# Patient Record
Sex: Female | Born: 1970 | Race: Black or African American | Hispanic: No | Marital: Single | Smoking: Never smoker
Health system: Southern US, Community
[De-identification: ages and names within clinical notes are randomized; demographics above are authoritative.]

## PROBLEM LIST (undated history)

## (undated) DIAGNOSIS — R011 Cardiac murmur, unspecified: Secondary | ICD-10-CM

## (undated) DIAGNOSIS — I10 Essential (primary) hypertension: Secondary | ICD-10-CM

## (undated) HISTORY — DX: Essential (primary) hypertension: I10

## (undated) HISTORY — PX: TONSILLECTOMY: SUR1361

## (undated) HISTORY — PX: FOOT SURGERY: SHX648

---

## 2014-08-28 ENCOUNTER — Other Ambulatory Visit: Payer: Self-pay

## 2014-08-28 ENCOUNTER — Other Ambulatory Visit (HOSPITAL_COMMUNITY)
Admission: RE | Admit: 2014-08-28 | Discharge: 2014-08-28 | Disposition: A | Discharge status: Home / Self Care | Payer: 59 | Source: Ambulatory Visit | Attending: Obstetrics and Gynecology | Admitting: Obstetrics and Gynecology

## 2014-08-28 DIAGNOSIS — Z1151 Encounter for screening for human papillomavirus (HPV): Secondary | ICD-10-CM | POA: Diagnosis present

## 2014-08-28 DIAGNOSIS — Z1231 Encounter for screening mammogram for malignant neoplasm of breast: Secondary | ICD-10-CM

## 2014-08-28 DIAGNOSIS — Z01419 Encounter for gynecological examination (general) (routine) without abnormal findings: Secondary | ICD-10-CM | POA: Insufficient documentation

## 2014-09-12 ENCOUNTER — Ambulatory Visit
Admission: RE | Admit: 2014-09-12 | Discharge: 2014-09-12 | Disposition: A | Discharge status: Home / Self Care | Payer: 59 | Source: Ambulatory Visit

## 2014-09-12 DIAGNOSIS — Z1231 Encounter for screening mammogram for malignant neoplasm of breast: Secondary | ICD-10-CM

## 2014-10-12 ENCOUNTER — Telehealth: Payer: Self-pay | Admitting: Oncology

## 2014-10-12 NOTE — Telephone Encounter (Signed)
S/W PT IN REF TO NP APPT. ON  10/26/14@10 :30 REFERRING DR VARNADO FAMILY HX OF CLOTTING DISORDER

## 2014-10-12 NOTE — Telephone Encounter (Signed)
DELIVERED CHART 10/12/14

## 2014-10-25 ENCOUNTER — Telehealth: Payer: Self-pay | Admitting: Medical Oncology

## 2014-10-25 NOTE — Telephone Encounter (Signed)
Confirmed tomorrow's appt with patient. Requested for patient to bring current list of medications. Pt denies questions at this time.

## 2014-10-26 ENCOUNTER — Telehealth: Payer: Self-pay | Admitting: Oncology

## 2014-10-26 ENCOUNTER — Other Ambulatory Visit: Payer: 59

## 2014-10-26 ENCOUNTER — Ambulatory Visit (HOSPITAL_BASED_OUTPATIENT_CLINIC_OR_DEPARTMENT_OTHER): Payer: 59 | Admitting: Oncology

## 2014-10-26 ENCOUNTER — Ambulatory Visit (HOSPITAL_BASED_OUTPATIENT_CLINIC_OR_DEPARTMENT_OTHER): Payer: 59

## 2014-10-26 ENCOUNTER — Ambulatory Visit: Payer: 59

## 2014-10-26 ENCOUNTER — Encounter: Payer: Self-pay | Admitting: Oncology

## 2014-10-26 VITALS — BP 123/63 | HR 86 | Temp 98.4°F | Resp 18 | Ht 73.5 in | Wt 256.8 lb

## 2014-10-26 DIAGNOSIS — Z86718 Personal history of other venous thrombosis and embolism: Secondary | ICD-10-CM

## 2014-10-26 NOTE — Telephone Encounter (Signed)
gv and printed avs and sent to lab

## 2014-10-26 NOTE — Progress Notes (Signed)
Checked in new patient with no issues prior to seeing the dr. She has appt card and [place] been out of the [place].

## 2014-10-26 NOTE — Progress Notes (Signed)
Please see consult note.  

## 2014-10-26 NOTE — Consult Note (Signed)
Reason for Referral: family history of thrombosis.  HPI: 43 year old woman with history of hypertension and chronic lower extremity edema but otherwise rather healthy referred to me for family history of thrombosis. She reports that she have not really had any personal history of deep vein thrombosis, pulmonary embolism, phlebitis or any arterial clots. Her sister however, developed a blood clot close to 10 years ago and required IVC filter placement and chronic anticoagulation with Coumadin. She reported that she had extensive testing although it is unclear that she has an inherited thrombophilia diagnosed. She denied any personal history of any thrombosis as mentioned that she did have one early trimester miscarriage. She did have one pregnancy courier to term years ago without any complications. She is currently on birth control in the form of oral contraceptives and have not had any thrombosis on. She also had foot surgery also without any thrombosis.  Clinically, she is completely asymptomatic at this point. She does not report any headaches or blurry vision or syncope. She does not report any fevers, chills, sweats or weight loss. She does not report any chest pain shortness of breath cough or hemoptysis. She does report lower extremity edema but no palpitation orthopnea. Does not report any nausea, vomiting or change in her bowel habits. She does not report any urinary symptoms. Rest of review of systems unremarkable.  Past Medical History  Diagnosis Date  . Hypertension   :  She is S/P foot surgery.   Medications: Current Outpatient Prescriptions  Medication Sig Dispense Refill  . furosemide (LASIX) 40 MG tablet Take 40 mg by mouth as needed (1-2 tablets as needed for edema).    . LO LOESTRIN FE 1 MG-10 MCG / 10 MCG tablet   12  . losartan-hydrochlorothiazide (HYZAAR) 100-25 MG per tablet   1   No current facility-administered medications for this visit.    Family history: No history  of documented thrombosis or blood clots. No history of strokes or myocardial infarctions. Her mother had lung cancer there is history of kidney cancer remotely in her family. Her sister had blood clots as mentioned above but no other thrombosis history.   History   Social History  . Marital Status: Unknown    Spouse Name: N/A    Number of Children: N/A  . Years of Education: N/A   Occupational History  . Not on file.   Social History Main Topics  . Smoking status: Not on file  . Smokeless tobacco: Not on file  . Alcohol Use: Not on file  . Drug Use: Not on file  . Sexual Activity: Not on file   Other Topics Concern  . Not on file   Social History Narrative  . No narrative on file  :  Pertinent items are noted in HPI.  Exam: ECOG 0 Blood pressure 123/63, pulse 86, temperature 98.4 F (36.9 C), temperature source Oral, resp. rate 18, height 6' 1.5" (1.867 m), weight 256 lb 12.8 oz (116.484 kg). General appearance: alert and cooperative Head: Normocephalic, without obvious abnormality Throat: lips, mucosa, and tongue normal; teeth and gums normal Neck: no adenopathy Back: negative Resp: clear to auscultation bilaterally Chest wall: no tenderness Cardio: regular rate and rhythm, S1, S2 normal, no murmur, click, rub or gallop GI: soft, non-tender; bowel sounds normal; no masses,  no organomegaly Extremities: extremities normal, atraumatic, no cyanosis or edema Pulses: 2+ and symmetric 1+ edema bilaterally. Skin: Skin color, texture, turgor normal. No rashes or lesions   Assessment and Plan:  43 year old woman with no personal history of thrombosis but a family history of thrombosis specifically related to her sister. It is unclear whether her sister had clear-cut diagnosed thrombophilia but appears to have had extensive blood clots that required IVC filter. She is currently followed by hematology. At this point, I feel that she is low risk of developing blood clots and  low risk of having an inherited thrombophilia. She had a pregnancy to term, previous surgery and long-term oral contraceptive use without documented history of thrombosis. But for completeness sake, I will obtain a hypercoagulable panel and communicate these findings to the patient. If she has no inherited thrombophilia, then no further testing will be required. Clearly, if she has anything abnormal will discuss that with her and not necessarily require anticoagulation but certainly precaution regarding oral contraceptives.  All her questions were answered to her satisfaction.

## 2014-10-31 LAB — HYPERCOAGULABLE PANEL, COMPREHENSIVE
ANTICARDIOLIPIN IGG: 10 GPL U/mL (ref <23)
AntiThromb III Func: 113 % (ref 76–126)
Anticardiolipin IgA: 7 APL U/mL (ref <22)
Anticardiolipin IgM: 1 MPL U/mL (ref <11)
BETA 2 GLYCO I IGG: 0 G Units (ref <20)
Beta-2-Glycoprotein I IgA: 8 A Units (ref <20)
Beta-2-Glycoprotein I IgM: 5 M Units (ref <20)
DRVVT: 35.8 s (ref <42.9)
Lupus Anticoagulant: NOT DETECTED
PROTEIN C ACTIVITY: 126 % (ref 75–133)
PROTEIN S ACTIVITY: 61 % — AB (ref 69–129)
PROTEIN S TOTAL: 64 % (ref 60–150)
PTT Lupus Anticoagulant: 27.8 secs — ABNORMAL LOW (ref 28.0–43.0)
Protein C, Total: 76 % (ref 72–160)

## 2015-10-24 ENCOUNTER — Encounter (HOSPITAL_COMMUNITY): Payer: Self-pay | Admitting: Family Medicine

## 2015-10-24 ENCOUNTER — Other Ambulatory Visit: Payer: Self-pay

## 2015-10-24 ENCOUNTER — Emergency Department (HOSPITAL_COMMUNITY): Payer: 59

## 2015-10-24 ENCOUNTER — Emergency Department (HOSPITAL_COMMUNITY)
Admission: EM | Admit: 2015-10-24 | Discharge: 2015-10-24 | Disposition: A | Discharge status: Home / Self Care | Payer: 59 | Attending: Physician Assistant | Admitting: Physician Assistant

## 2015-10-24 DIAGNOSIS — R079 Chest pain, unspecified: Secondary | ICD-10-CM | POA: Insufficient documentation

## 2015-10-24 DIAGNOSIS — Z79899 Other long term (current) drug therapy: Secondary | ICD-10-CM | POA: Diagnosis not present

## 2015-10-24 DIAGNOSIS — R011 Cardiac murmur, unspecified: Secondary | ICD-10-CM | POA: Diagnosis not present

## 2015-10-24 DIAGNOSIS — I10 Essential (primary) hypertension: Secondary | ICD-10-CM | POA: Diagnosis not present

## 2015-10-24 HISTORY — DX: Cardiac murmur, unspecified: R01.1

## 2015-10-24 LAB — BASIC METABOLIC PANEL
Anion gap: 8 (ref 5–15)
BUN: 10 mg/dL (ref 6–20)
CALCIUM: 8.7 mg/dL — AB (ref 8.9–10.3)
CO2: 29 mmol/L (ref 22–32)
CREATININE: 0.92 mg/dL (ref 0.44–1.00)
Chloride: 101 mmol/L (ref 101–111)
GFR calc non Af Amer: >60 mL/min (ref >60)
Glucose, Bld: 94 mg/dL (ref 65–99)
Potassium: 2.7 mmol/L — CL (ref 3.5–5.1)
SODIUM: 138 mmol/L (ref 135–145)

## 2015-10-24 LAB — LIPASE, BLOOD: Lipase: 26 U/L (ref 11–51)

## 2015-10-24 LAB — CBC
HCT: 36.6 % (ref 36.0–46.0)
Hemoglobin: 12.1 g/dL (ref 12.0–15.0)
MCH: 27.6 pg (ref 26.0–34.0)
MCHC: 33.1 g/dL (ref 30.0–36.0)
MCV: 83.6 fL (ref 78.0–100.0)
PLATELETS: 270 10*3/uL (ref 150–400)
RBC: 4.38 MIL/uL (ref 3.87–5.11)
RDW: 13 % (ref 11.5–15.5)
WBC: 4.4 10*3/uL (ref 4.0–10.5)

## 2015-10-24 LAB — D-DIMER, QUANTITATIVE (NOT AT ARMC): D DIMER QUANT: 0.28 ug{FEU}/mL (ref 0.00–0.48)

## 2015-10-24 LAB — I-STAT TROPONIN, ED: TROPONIN I, POC: 0.01 ng/mL (ref 0.00–0.08)

## 2015-10-24 MED ORDER — POTASSIUM CHLORIDE CRYS ER 20 MEQ PO TBCR
40.0000 meq | EXTENDED_RELEASE_TABLET | Freq: Once | ORAL | Status: AC
  Administered 2015-10-24: 40 meq via ORAL
  Filled 2015-10-24: qty 2

## 2015-10-24 MED ORDER — ASPIRIN 81 MG PO CHEW
324.0000 mg | CHEWABLE_TABLET | Freq: Once | ORAL | Status: AC
  Administered 2015-10-24: 324 mg via ORAL
  Filled 2015-10-24: qty 4

## 2015-10-24 MED ORDER — POTASSIUM CHLORIDE 10 MEQ/100ML IV SOLN
10.0000 meq | Freq: Once | INTRAVENOUS | Status: AC
  Administered 2015-10-24: 10 meq via INTRAVENOUS
  Filled 2015-10-24: qty 100

## 2015-10-24 NOTE — ED Notes (Signed)
Pt here for chest pain that started last night. sts was just sitting when it started. Denies injury or strenuous activity.

## 2015-10-24 NOTE — ED Notes (Signed)
Pt placed in gown and in bed. Pt monitored by pulse ox, bp cuff, and 12-lead. 

## 2015-10-24 NOTE — ED Provider Notes (Signed)
CSN: [id number]     Arrival date & time 10/24/15  0807 History   First MD Initiated Contact with Patient 10/24/15 0818     Chief Complaint  Patient presents with  . Chest Pain   (Consider location/radiation/quality/duration/timing/severity/associated sxs/prior Treatment) HPI Comments: Report dull left sided chest pain starting at 8 pm last night after getting out of shower. She had difficulties sleeping due to pain 7/10 so came in for evaluation. Denies SOB, palpitations, dizziness, cough, fevers or history of MI, CAD or PE. Recently diagnosed with mitral regurg  Patient is a 44 y.o. female presenting with chest pain. The history is provided by the patient.  Chest Pain Pain location:  L lateral chest Pain quality: aching   Pain radiates to:  Upper back Pain radiates to the back: yes   Pain severity:  Moderate Onset quality:  Gradual Duration:  12 hours Timing:  Constant Progression:  Waxing and waning Chronicity:  New Context: at rest   Context: not breathing, no drug use, not eating, not lifting, no movement, no stress and no trauma   Relieved by:  Certain positions Worsened by:  Nothing tried Ineffective treatments:  None tried Associated symptoms: lower extremity edema   Associated symptoms: no abdominal pain, no altered mental status, no anorexia, no anxiety, no claudication, no cough, no diaphoresis, no dizziness, no fatigue, no fever, no headache, no heartburn, no nausea, no near-syncope, no numbness, no orthopnea, no palpitations, no shortness of breath and no weakness   Risk factors: birth control and hypertension   Risk factors: no coronary artery disease, no diabetes mellitus, not female, not pregnant, no prior DVT/PE, no smoking and no surgery     Past Medical History  Diagnosis Date  . Hypertension   . Heart murmur    Past Surgical History  Procedure Laterality Date  . Tonsillectomy     History reviewed. No pertinent family history. Social History  Substance Use  Topics  . Smoking status: Never Smoker   . Smokeless tobacco: None  . Alcohol Use: No   OB History    No data available     Review of Systems  Constitutional: Negative for fever, diaphoresis, activity change, appetite change and fatigue.  HENT: Negative.   Respiratory: Negative for apnea, cough, chest tightness and shortness of breath.   Cardiovascular: Positive for chest pain and leg swelling. Negative for palpitations, orthopnea, claudication and near-syncope.  Gastrointestinal: Negative for heartburn, nausea, abdominal pain and anorexia.  Skin: Negative for rash.  Neurological: Negative for dizziness, weakness, numbness and headaches.    Allergies  Keflex  Home Medications   Prior to Admission medications   Medication Sig Start Date End Date Taking? Authorizing Provider  furosemide (LASIX) 40 MG tablet Take 40 mg by mouth as needed (1-2 tablets as needed for edema).   Yes Historical Provider, MD  losartan-hydrochlorothiazide (HYZAAR) 100-25 MG per tablet  10/04/14  Yes Historical Provider, MD  MINASTRIN 24 FE 1-20 MG-MCG(24) CHEW Chew 1 tablet by mouth daily. 10/11/15  Yes Historical Provider, MD   BP 144/86 mmHg  Pulse 65  Temp(Src) 98.5 F (36.9 C)  Resp 14  SpO2 100%  LMP 10/09/2015 Physical Exam  Constitutional: She is oriented to person, place, and time. She appears well-developed and well-nourished. No distress.  Eyes: Pupils are equal, round, and reactive to light.  Cardiovascular: Normal rate, regular rhythm and normal heart sounds.   No murmur heard. No chest wall tenderness  Pulmonary/Chest: Effort normal and breath sounds normal.  No respiratory distress.  Abdominal: Soft. Bowel sounds are normal. There is no tenderness.  Musculoskeletal: Normal range of motion.  Neurological: She is alert and oriented to person, place, and time. No cranial nerve deficit.  Skin: No rash noted. She is not diaphoretic.    ED Course  Procedures (including critical care  time) Labs Review Labs Reviewed  BASIC METABOLIC PANEL - Abnormal; Notable for the following:    Potassium 2.7 (*)    Calcium 8.7 (*)    All other components within normal limits  CBC  LIPASE, BLOOD  D-DIMER, QUANTITATIVE (NOT AT [facility])  Randolm Idol, ED    Imaging Review Dg Chest 2 View  10/24/2015  CLINICAL DATA:  LEFT breast pain. Aching chest pain. Pain for 24 hours. EXAM: CHEST  2 VIEW COMPARISON:  None. FINDINGS: Normal cardiac silhouette. No effusion, infiltrate, or pneumothorax. Nodular densities at lung bases likely represent nipple shadows. No osseous abnormality. IMPRESSION: No acute cardiopulmonary process. Electronically Signed   By: Suzy Bouchard M.D.   On: 10/24/2015 08:56   I have personally reviewed and evaluated these images and lab results as part of my medical decision-making.   EKG Interpretation   Date/Time:  Wednesday October 24 2015 08:59:29 EST Ventricular Rate:  66 PR Interval:  143 QRS Duration: 80 QT Interval:  396 QTC Calculation: 415 R Axis:   60 Text Interpretation:  Sinus rhythm Borderline repolarization abnormality  no acute ischemia T wave in version lead 3 No significant change since  last tracing Confirmed by Gerald Leitz ([id number]) on 10/24/2015 9:16:57  AM      MDM   Final diagnoses:  Chest pain, unspecified chest pain type    44 year old female with reported mitral valve prolapse comes in for evaluation of chest pain.  Initial EKG and troponin negative for ACS.  Chest x-ray was nonrevealing.  Due to family history of blood clots and current birth control use a D-dimer was obtained and was negative.  Heart score 1; her chest pain was most likely muscle skeletal in nature and she was deemed safe to follow-up with PCP and cardiologist for ongoing evaluation needed.  Blood work revealed hypokalemia which was replaced, and she was discharged to follow with her PCP for repeat potassium check.     Olam Idler, MD 10/24/15  1024  Courteney Julio Alm, MD 10/24/15 1527

## 2015-10-24 NOTE — Discharge Instructions (Signed)
The blood work and chest x-ray were negative for sign of heart or lung problems. Your chest pain is most likely musculoskeletal in nature.  You can take Tylenol and ibuprofen as needed.  Call or return to the ED if he develops palpitations, shortness of breath, dizziness.  Recommend following up with your primary care and cardiologist for your low potassium.   Chest Wall Pain Chest wall pain is pain in or around the bones and muscles of your chest. Sometimes, an injury causes this pain. Sometimes, the cause may not be known. This pain may take several weeks or longer to get better. HOME CARE INSTRUCTIONS  Pay attention to any changes in your symptoms. Take these actions to help with your pain:   Rest as told by your health care provider.   Avoid activities that cause pain. These include any activities that use your chest muscles or your abdominal and side muscles to lift heavy items.   If directed, apply ice to the painful area:  Put ice in a plastic bag.  Place a towel between your skin and the bag.  Leave the ice on for 20 minutes, 2-3 times per day.  Take over-the-counter and prescription medicines only as told by your health care provider.  Do not use tobacco products, including cigarettes, chewing tobacco, and e-cigarettes. If you need help quitting, ask your health care provider.  Keep all follow-up visits as told by your health care provider. This is important. SEEK MEDICAL CARE IF:  You have a fever.  Your chest pain becomes worse.  You have new symptoms. SEEK IMMEDIATE MEDICAL CARE IF:  You have nausea or vomiting.  You feel sweaty or light-headed.  You have a cough with phlegm (sputum) or you cough up blood.  You develop shortness of breath.   This information is not intended to replace advice given to you by your health care provider. Make sure you discuss any questions you have with your health care provider.   Document Released: 12/01/2005 Document Revised:  08/22/2015 Document Reviewed: 02/26/2015 Elsevier Interactive Patient Education [employer].

## 2015-10-24 NOTE — ED Notes (Signed)
Critical lab  Potassium 2.7

## 2016-01-25 ENCOUNTER — Other Ambulatory Visit: Payer: Self-pay

## 2016-01-25 DIAGNOSIS — Z1231 Encounter for screening mammogram for malignant neoplasm of breast: Secondary | ICD-10-CM

## 2016-02-13 ENCOUNTER — Ambulatory Visit
Admission: RE | Admit: 2016-02-13 | Discharge: 2016-02-13 | Disposition: A | Discharge status: Home / Self Care | Payer: 59 | Source: Ambulatory Visit

## 2016-02-13 DIAGNOSIS — Z1231 Encounter for screening mammogram for malignant neoplasm of breast: Secondary | ICD-10-CM

## 2016-04-15 ENCOUNTER — Emergency Department (HOSPITAL_COMMUNITY): Payer: 59

## 2016-04-15 ENCOUNTER — Emergency Department (HOSPITAL_COMMUNITY)
Admission: EM | Admit: 2016-04-15 | Discharge: 2016-04-16 | Disposition: A | Discharge status: Home / Self Care | Payer: 59 | Attending: Emergency Medicine | Admitting: Emergency Medicine

## 2016-04-15 ENCOUNTER — Encounter (HOSPITAL_COMMUNITY): Payer: Self-pay | Admitting: Emergency Medicine

## 2016-04-15 DIAGNOSIS — M7989 Other specified soft tissue disorders: Secondary | ICD-10-CM | POA: Insufficient documentation

## 2016-04-15 DIAGNOSIS — R079 Chest pain, unspecified: Secondary | ICD-10-CM

## 2016-04-15 DIAGNOSIS — R011 Cardiac murmur, unspecified: Secondary | ICD-10-CM | POA: Insufficient documentation

## 2016-04-15 DIAGNOSIS — I10 Essential (primary) hypertension: Secondary | ICD-10-CM | POA: Diagnosis not present

## 2016-04-15 DIAGNOSIS — Z79899 Other long term (current) drug therapy: Secondary | ICD-10-CM | POA: Insufficient documentation

## 2016-04-15 LAB — CBC
HCT: 37.1 % (ref 36.0–46.0)
Hemoglobin: 12.1 g/dL (ref 12.0–15.0)
MCH: 27.1 pg (ref 26.0–34.0)
MCHC: 32.6 g/dL (ref 30.0–36.0)
MCV: 83 fL (ref 78.0–100.0)
PLATELETS: 283 10*3/uL (ref 150–400)
RBC: 4.47 MIL/uL (ref 3.87–5.11)
RDW: 13.4 % (ref 11.5–15.5)
WBC: 4.5 10*3/uL (ref 4.0–10.5)

## 2016-04-15 LAB — BASIC METABOLIC PANEL
ANION GAP: 9 (ref 5–15)
BUN: 9 mg/dL (ref 6–20)
CALCIUM: 9.1 mg/dL (ref 8.9–10.3)
CHLORIDE: 104 mmol/L (ref 101–111)
CO2: 26 mmol/L (ref 22–32)
CREATININE: 0.96 mg/dL (ref 0.44–1.00)
GFR calc Af Amer: >60 mL/min (ref >60)
GFR calc non Af Amer: >60 mL/min (ref >60)
Glucose, Bld: 113 mg/dL — ABNORMAL HIGH (ref 65–99)
POTASSIUM: 4.1 mmol/L (ref 3.5–5.1)
Sodium: 139 mmol/L (ref 135–145)

## 2016-04-15 LAB — I-STAT TROPONIN, ED: TROPONIN I, POC: 0 ng/mL (ref 0.00–0.08)

## 2016-04-15 NOTE — ED Notes (Signed)
Pt reports left sided CP which started on the way home from work. Pt denies all other associated symptoms. PT alert x4. NAD at this time.

## 2016-04-15 NOTE — ED Provider Notes (Signed)
CSN: [id number]     Arrival date & time 04/15/16  1814 History   First MD Initiated Contact with Patient 04/15/16 2311     Chief Complaint  Patient presents with  . Chest Pain     (Consider location/radiation/quality/duration/timing/severity/associated sxs/prior Treatment) HPI Patient has history of similar chest pains. She reports that she got left-sided chest panel way home from work. It is sharp in nature. No associated symptoms. No nausea, no diaphoresis, no radiation of pain, no shortness of breath. Patient reports both legs chronically swell. She reports she had her "toes straightened out" in 2002 and ever since, she gets bilateral ankle edema with standing. No calf pain. No history of PE or DVT. Past Medical History  Diagnosis Date  . Hypertension   . Heart murmur    Past Surgical History  Procedure Laterality Date  . Tonsillectomy     No family history on file. Social History  Substance Use Topics  . Smoking status: Never Smoker   . Smokeless tobacco: None  . Alcohol Use: No   OB History    No data available     Review of Systems 10 Systems reviewed and are negative for acute change except as noted in the HPI.    Allergies  Keflex  Home Medications   Prior to Admission medications   Medication Sig Start Date End Date Taking? Authorizing Provider  furosemide (LASIX) 40 MG tablet Take 40 mg by mouth as needed (1-2 tablets as needed for edema).    Historical Provider, MD  HYDROcodone-acetaminophen (NORCO/VICODIN) 5-325 MG tablet Take 1-2 tablets by mouth every 4 (four) hours as needed for moderate pain or severe pain. 04/16/16   Charlesetta Shanks, MD  losartan-hydrochlorothiazide (HYZAAR) 100-25 MG per tablet  10/04/14   Historical Provider, MD  MINASTRIN 24 FE 1-20 MG-MCG(24) CHEW Chew 1 tablet by mouth daily. 10/11/15   Historical Provider, MD  pantoprazole (PROTONIX) 20 MG tablet Take 1 tablet (20 mg total) by mouth daily. 04/16/16   Charlesetta Shanks, MD   BP 170/92  mmHg  Pulse 73  Temp(Src) 98.1 F (36.7 C) (Oral)  Resp 18  SpO2 99%  LMP 03/28/2016 (Exact Date) Physical Exam  Constitutional: She is oriented to person, place, and time. She appears well-developed and well-nourished.  HENT:  Head: Normocephalic and atraumatic.  Eyes: EOM are normal. Pupils are equal, round, and reactive to light.  Neck: Neck supple.  Cardiovascular: Normal rate, regular rhythm, normal heart sounds and intact distal pulses.   Pulmonary/Chest: Effort normal and breath sounds normal.  Abdominal: Soft. Bowel sounds are normal. She exhibits no distension. There is no tenderness.  Musculoskeletal: Normal range of motion. She exhibits no edema.  Neurological: She is alert and oriented to person, place, and time. She has normal strength. Coordination normal. GCS eye subscore is 4. GCS verbal subscore is 5. GCS motor subscore is 6.  Skin: Skin is warm, dry and intact.  Psychiatric: She has a normal mood and affect.    ED Course  Procedures (including critical care time) Labs Review Labs Reviewed  BASIC METABOLIC PANEL - Abnormal; Notable for the following:    Glucose, Bld 113 (*)    All other components within normal limits  CBC  I-STAT TROPOININ, ED    Imaging Review Dg Chest 2 View  04/15/2016  CLINICAL DATA:  Chest pain starting today EXAM: CHEST  2 VIEW COMPARISON:  October 24, 2015 FINDINGS: The heart size and mediastinal contours are within normal limits. Both lungs  are clear. Nipple shadow is identified over left lung base unchanged. The visualized skeletal structures are unremarkable. IMPRESSION: No active cardiopulmonary disease. Electronically Signed   By: Abelardo Diesel M.D.   On: 04/15/2016 19:28   I have personally reviewed and evaluated these images and lab results as part of my medical decision-making.   EKG Interpretation   Date/Time:  Tuesday Apr 15 2016 18:34:01 EDT Ventricular Rate:  90 PR Interval:  122 QRS Duration: 86 QT Interval:  374 QTC  Calculation: 457 R Axis:   59 Text Interpretation:  Normal sinus rhythm Normal ECG agree. Confirmed by  Johnney Killian, MD, Jeannie Done [id number]) on 04/15/2016 11:12:13 PM      MDM   Final diagnoses:  Chest pain, unspecified chest pain type   Patient got episodic sharp chest pains. Without associated symptoms. EKG and cardiac enzymes are negative. Patient does not appear to have immediate DVT risk factors. At this time consideration is for possible reflux symptoms or pleuritic type symptoms. Patient will be placed up with tonic Singulair and Vicodin to take as needed for pain. She is counseled to follow-up with her family doctor this week and discuss cardiac stress testing as an outpatient.    Charlesetta Shanks, MD 04/16/16 [phone]

## 2016-04-16 MED ORDER — HYDROCODONE-ACETAMINOPHEN 5-325 MG PO TABS: 1.0000 | ORAL_TABLET | ORAL | Status: DC | PRN

## 2016-04-16 MED ORDER — HYDROCODONE-ACETAMINOPHEN 5-325 MG PO TABS
1.0000 | ORAL_TABLET | Freq: Once | ORAL | Status: AC
  Administered 2016-04-16: 1 via ORAL
  Filled 2016-04-16: qty 1

## 2016-04-16 MED ORDER — PANTOPRAZOLE SODIUM 20 MG PO TBEC
20.0000 mg | DELAYED_RELEASE_TABLET | Freq: Once | ORAL | Status: AC
  Administered 2016-04-16: 20 mg via ORAL
  Filled 2016-04-16: qty 1

## 2016-04-16 MED ORDER — PANTOPRAZOLE SODIUM 20 MG PO TBEC: 20.0000 mg | DELAYED_RELEASE_TABLET | Freq: Every day | ORAL | Status: DC

## 2016-04-16 NOTE — Discharge Instructions (Signed)
Nonspecific Chest Pain  °Chest pain can be caused by many different conditions. There is always a chance that your pain could be related to something serious, such as a heart attack or a blood clot in your lungs. Chest pain can also be caused by conditions that are not life-threatening. If you have chest pain, it is very important to follow up with your health care provider. °CAUSES  °Chest pain can be caused by: °· Heartburn. °· Pneumonia or bronchitis. °· Anxiety or stress. °· Inflammation around your heart (pericarditis) or lung (pleuritis or pleurisy). °· A blood clot in your lung. °· A collapsed lung (pneumothorax). It can develop suddenly on its own (spontaneous pneumothorax) or from trauma to the chest. °· Shingles infection (varicella-zoster virus). °· Heart attack. °· Damage to the bones, muscles, and cartilage that make up your chest wall. This can include: °· Bruised bones due to injury. °· Strained muscles or cartilage due to frequent or repeated coughing or overwork. °· Fracture to one or more ribs. °· Sore cartilage due to inflammation (costochondritis). °RISK FACTORS  °Risk factors for chest pain may include: °· Activities that increase your risk for trauma or injury to your chest. °· Respiratory infections or conditions that cause frequent coughing. °· Medical conditions or overeating that can cause heartburn. °· Heart disease or family history of heart disease. °· Conditions or health behaviors that increase your risk of developing a blood clot. °· Having had chicken pox (varicella zoster). °SIGNS AND SYMPTOMS °Chest pain can feel like: °· Burning or tingling on the surface of your chest or deep in your chest. °· Crushing, pressure, aching, or squeezing pain. °· Dull or sharp pain that is worse when you move, cough, or take a deep breath. °· Pain that is also felt in your back, neck, shoulder, or arm, or pain that spreads to any of these areas. °Your chest pain may come and go, or it may stay  constant. °DIAGNOSIS °Lab tests or other studies may be needed to find the cause of your pain. Your health care provider may have you take a test called an ambulatory ECG (electrocardiogram). An ECG records your heartbeat patterns at the time the test is performed. You may also have other tests, such as: °· Transthoracic echocardiogram (TTE). During echocardiography, sound waves are used to create a picture of all of the heart structures and to look at how blood flows through your heart. °· Transesophageal echocardiogram (TEE). This is a more advanced imaging test that obtains images from inside your body. It allows your health care provider to see your heart in finer detail. °· Cardiac monitoring. This allows your health care provider to monitor your heart rate and rhythm in real time. °· Holter monitor. This is a portable device that records your heartbeat and can help to diagnose abnormal heartbeats. It allows your health care provider to track your heart activity for several days, if needed. °· Stress tests. These can be done through exercise or by taking medicine that makes your heart beat more quickly. °· Blood tests. °· Imaging tests. °TREATMENT  °Your treatment depends on what is causing your chest pain. Treatment may include: °· Medicines. These may include: °· Acid blockers for heartburn. °· Anti-inflammatory medicine. °· Pain medicine for inflammatory conditions. °· Antibiotic medicine, if an infection is present. °· Medicines to dissolve blood clots. °· Medicines to treat coronary artery disease. °· Supportive care for conditions that do not require medicines. This may include: °· Resting. °· Applying heat   or cold packs to injured areas. °· Limiting activities until pain decreases. °HOME CARE INSTRUCTIONS °· If you were prescribed an antibiotic medicine, finish it all even if you start to feel better. °· Avoid any activities that bring on chest pain. °· Do not use any tobacco products, including  cigarettes, chewing tobacco, or electronic cigarettes. If you need help quitting, ask your health care provider. °· Do not drink alcohol. °· Take medicines only as directed by your health care provider. °· Keep all follow-up visits as directed by your health care provider. This is important. This includes any further testing if your chest pain does not go away. °· If heartburn is the cause for your chest pain, you may be told to keep your head raised (elevated) while sleeping. This reduces the chance that acid will go from your stomach into your esophagus. °· Make lifestyle changes as directed by your health care provider. These may include: °¨ Getting regular exercise. Ask your health care provider to suggest some activities that are safe for you. °¨ Eating a heart-healthy diet. A registered dietitian can help you to learn healthy eating options. °¨ Maintaining a healthy weight. °¨ Managing diabetes, if necessary. °¨ Reducing stress. °SEEK MEDICAL CARE IF: °· Your chest pain does not go away after treatment. °· You have a rash with blisters on your chest. °· You have a fever. °SEEK IMMEDIATE MEDICAL CARE IF:  °· Your chest pain is worse. °· You have an increasing cough, or you cough up blood. °· You have severe abdominal pain. °· You have severe weakness. °· You faint. °· You have chills. °· You have sudden, unexplained chest discomfort. °· You have sudden, unexplained discomfort in your arms, back, neck, or jaw. °· You have shortness of breath at any time. °· You suddenly start to sweat, or your skin gets clammy. °· You feel nauseous or you vomit. °· You suddenly feel light-headed or dizzy. °· Your heart begins to beat quickly, or it feels like it is skipping beats. °These symptoms may represent a serious problem that is an emergency. Do not wait to see if the symptoms will go away. Get medical help right away. Call your local emergency services (911 in the [place] Do not drive yourself to the hospital. °  °This  information is not intended to replace advice given to you by your health care provider. Make sure you discuss any questions you have with your health care provider. °  °Document Released: 09/10/2005 Document Revised: 12/22/2014 Document Reviewed: 07/07/2014 °Elsevier Interactive Patient Education ©2016 [employer] °Suspected Gastroesophageal Reflux Disease, Adult °Normally, food travels down the esophagus and stays in the stomach to be digested. However, when a person has gastroesophageal reflux disease (GERD), food and stomach acid move back up into the esophagus. When this happens, the esophagus becomes sore and inflamed. Over time, GERD can create small holes (ulcers) in the lining of the esophagus.  °CAUSES °This condition is caused by a problem with the muscle between the esophagus and the stomach (lower esophageal sphincter, or LES). Normally, the LES muscle closes after food passes through the esophagus to the stomach. When the LES is weakened or abnormal, it does not close properly, and that allows food and stomach acid to go back up into the esophagus. The LES can be weakened by certain dietary substances, medicines, and medical conditions, including: °· Tobacco use. °· Pregnancy. °· Having a hiatal hernia. °· Heavy alcohol use. °· Certain foods and beverages, such as coffee,   chocolate, onions, and peppermint. °RISK FACTORS °This condition is more likely to develop in: °· People who have an increased body weight. °· People who have connective tissue disorders. °· People who use NSAID medicines. °SYMPTOMS °Symptoms of this condition include: °· Heartburn. °· Difficult or painful swallowing. °· The feeling of having a lump in the throat. °· A bitter taste in the mouth. °· Bad breath. °· Having a large amount of saliva. °· Having an upset or bloated stomach. °· Belching. °· Chest pain. °· Shortness of breath or wheezing. °· Ongoing (chronic) cough or a night-time cough. °· Wearing away of tooth  enamel. °· Weight loss. °Different conditions can cause chest pain. Make sure to see your health care provider if you experience chest pain. °DIAGNOSIS °Your health care provider will take a medical history and perform a physical exam. To determine if you have mild or severe GERD, your health care provider may also monitor how you respond to treatment. You may also have other tests, including: °· An endoscopy to examine your stomach and esophagus with a small camera. °· A test that measures the acidity level in your esophagus. °· A test that measures how much pressure is on your esophagus. °· A barium swallow or modified barium swallow to show the shape, size, and functioning of your esophagus. °TREATMENT °The goal of treatment is to help relieve your symptoms and to prevent complications. Treatment for this condition may vary depending on how severe your symptoms are. Your health care provider may recommend: °· Changes to your diet. °· Medicine. °· Surgery. °HOME CARE INSTRUCTIONS °Diet °· Follow a diet as recommended by your health care provider. This may involve avoiding foods and drinks such as: °¨ Coffee and tea (with or without caffeine). °¨ Drinks that contain alcohol. °¨ Energy drinks and sports drinks. °¨ Carbonated drinks or sodas. °¨ Chocolate and cocoa. °¨ Peppermint and mint flavorings. °¨ Garlic and onions. °¨ Horseradish. °¨ Spicy and acidic foods, including peppers, chili powder, curry powder, vinegar, hot sauces, and barbecue sauce. °¨ Citrus fruit juices and citrus fruits, such as oranges, lemons, and limes. °¨ Tomato-based foods, such as red sauce, chili, salsa, and pizza with red sauce. °¨ Fried and fatty foods, such as donuts, french fries, potato chips, and high-fat dressings. °¨ High-fat meats, such as hot dogs and fatty cuts of red and white meats, such as rib eye steak, sausage, ham, and bacon. °¨ High-fat dairy items, such as whole milk, butter, and cream cheese. °· Eat small, frequent  meals instead of large meals. °· Avoid drinking large amounts of liquid with your meals. °· Avoid eating meals during the 2-3 hours before bedtime. °· Avoid lying down right after you eat. °· Do not exercise right after you eat. ° General Instructions  °· Pay attention to any changes in your symptoms. °· Take over-the-counter and prescription medicines only as told by your health care provider. Do not take aspirin, ibuprofen, or other NSAIDs unless your health care provider told you to do so. °· Do not use any tobacco products, including cigarettes, chewing tobacco, and e-cigarettes. If you need help quitting, ask your health care provider. °· Wear loose-fitting clothing. Do not wear anything tight around your waist that causes pressure on your abdomen. °· Raise (elevate) the head of your bed 6 inches (15cm). °· Try to reduce your stress, such as with yoga or meditation. If you need help reducing stress, ask your health care provider. °· If you are overweight, reduce your weight to an amount that is   healthy for you. Ask your health care provider for guidance about a safe weight loss goal. °· Keep all follow-up visits as told by your health care provider. This is important. °SEEK MEDICAL CARE IF: °· You have new symptoms. °· You have unexplained weight loss. °· You have difficulty swallowing, or it hurts to swallow. °· You have wheezing or a persistent cough. °· Your symptoms do not improve with treatment. °· You have a hoarse voice. °SEEK IMMEDIATE MEDICAL CARE IF: °· You have pain in your arms, neck, jaw, teeth, or back. °· You feel sweaty, dizzy, or light-headed. °· You have chest pain or shortness of breath. °· You vomit and your vomit looks like blood or coffee grounds. °· You faint. °· Your stool is bloody or black. °· You cannot swallow, drink, or eat. °  °This information is not intended to replace advice given to you by your health care provider. Make sure you discuss any questions you have with your health  care provider. °  °Document Released: 09/10/2005 Document Revised: 08/22/2015 Document Reviewed: 03/28/2015 °Elsevier Interactive Patient Education ©2016 [employer]vier Inc. ° °

## 2016-04-16 NOTE — ED Notes (Signed)
Pt verbalized understanding of discharge instructions and follow up care

## 2016-05-07 ENCOUNTER — Other Ambulatory Visit (HOSPITAL_COMMUNITY)
Admission: RE | Admit: 2016-05-07 | Discharge: 2016-05-07 | Disposition: A | Discharge status: Home / Self Care | Payer: 59 | Source: Ambulatory Visit | Attending: Obstetrics and Gynecology | Admitting: Obstetrics and Gynecology

## 2016-05-07 ENCOUNTER — Other Ambulatory Visit: Payer: Self-pay | Admitting: Obstetrics and Gynecology

## 2016-05-07 DIAGNOSIS — Z1151 Encounter for screening for human papillomavirus (HPV): Secondary | ICD-10-CM | POA: Diagnosis not present

## 2016-05-07 DIAGNOSIS — Z01419 Encounter for gynecological examination (general) (routine) without abnormal findings: Secondary | ICD-10-CM | POA: Diagnosis present

## 2016-05-08 LAB — CYTOLOGY - PAP

## 2017-01-13 DIAGNOSIS — N920 Excessive and frequent menstruation with regular cycle: Secondary | ICD-10-CM | POA: Diagnosis not present

## 2017-02-02 DIAGNOSIS — I10 Essential (primary) hypertension: Secondary | ICD-10-CM | POA: Diagnosis not present

## 2017-02-02 DIAGNOSIS — Z79899 Other long term (current) drug therapy: Secondary | ICD-10-CM | POA: Diagnosis not present

## 2017-03-24 DIAGNOSIS — Z01810 Encounter for preprocedural cardiovascular examination: Secondary | ICD-10-CM | POA: Diagnosis not present

## 2017-03-24 DIAGNOSIS — I340 Nonrheumatic mitral (valve) insufficiency: Secondary | ICD-10-CM | POA: Diagnosis not present

## 2017-03-24 DIAGNOSIS — I10 Essential (primary) hypertension: Secondary | ICD-10-CM | POA: Diagnosis not present

## 2017-03-31 DIAGNOSIS — N920 Excessive and frequent menstruation with regular cycle: Secondary | ICD-10-CM | POA: Diagnosis not present

## 2017-03-31 DIAGNOSIS — Z01818 Encounter for other preprocedural examination: Secondary | ICD-10-CM | POA: Diagnosis not present

## 2017-03-31 NOTE — Patient Instructions (Signed)
Your procedure is scheduled on:  Wednesday, April 08, 2017  Enter through the [employer] [facility] at:  6:00 AM  Pick up the phone at the desk and dial [phone].  Call this number if you have problems the morning of surgery: [phone].  Remember: Do NOT eat food or drink after:  Midnight Tuesday  Take these medicines the morning of surgery with a SIP OF WATER:  Losartan, Spironolactone  Stop ALL herbal medications at this time  Do NOT smoke the day of surgery.  Do NOT wear jewelry (body piercing), metal hair clips/bobby pins, make-up, or nail polish. Do NOT wear lotions, powders, or perfumes.  You may wear deodorant. Do NOT shave for 48 hours prior to surgery. Do NOT bring valuables to the hospital. Contacts, dentures, or bridgework may not be worn into surgery.  Have a responsible adult drive you home and stay with you for 24 hours after your procedure  Bring a copy of your healthcare power of attorney and living will documents.

## 2017-04-01 ENCOUNTER — Encounter (HOSPITAL_COMMUNITY)
Admission: RE | Admit: 2017-04-01 | Discharge: 2017-04-01 | Disposition: A | Discharge status: Home / Self Care | Payer: 59 | Source: Ambulatory Visit | Attending: Obstetrics and Gynecology | Admitting: Obstetrics and Gynecology

## 2017-04-01 ENCOUNTER — Encounter (HOSPITAL_COMMUNITY): Payer: Self-pay

## 2017-04-01 DIAGNOSIS — Z01812 Encounter for preprocedural laboratory examination: Secondary | ICD-10-CM | POA: Insufficient documentation

## 2017-04-01 LAB — BASIC METABOLIC PANEL
Anion gap: 6 (ref 5–15)
BUN: 13 mg/dL (ref 6–20)
CHLORIDE: 106 mmol/L (ref 101–111)
CO2: 25 mmol/L (ref 22–32)
CREATININE: 1 mg/dL (ref 0.44–1.00)
Calcium: 9.2 mg/dL (ref 8.9–10.3)
GFR calc non Af Amer: >60 mL/min (ref >60)
Glucose, Bld: 93 mg/dL (ref 65–99)
POTASSIUM: 4.3 mmol/L (ref 3.5–5.1)
Sodium: 137 mmol/L (ref 135–145)

## 2017-04-01 LAB — CBC
HEMATOCRIT: 37.8 % (ref 36.0–46.0)
HEMOGLOBIN: 12.4 g/dL (ref 12.0–15.0)
MCH: 27.7 pg (ref 26.0–34.0)
MCHC: 32.8 g/dL (ref 30.0–36.0)
MCV: 84.4 fL (ref 78.0–100.0)
PLATELETS: 241 10*3/uL (ref 150–400)
RBC: 4.48 MIL/uL (ref 3.87–5.11)
RDW: 13.1 % (ref 11.5–15.5)
WBC: 4.1 10*3/uL (ref 4.0–10.5)

## 2017-04-01 NOTE — H&P (Signed)
History of Present Illness  General:  Pt is presenting for preop for hysteroscopic myomectomy and HTA endometrial ablation due to menorrhagia and a submucosal fibroid. Pt was seen at the cardiologist,Dr. Tollie Eth. BP was 160-170s/90s. Pt unsure of systolic. Started on Spironolactone just once daily and BP has improved. Pt has a heart murmer which was worked in 2016. Pt has been cleared for surgery.   Current Medications  Taking   Denavir(Penciclovir) 1 % Cream 1 application to affected area Externally as directed   Lasix(Furosemide) 40 MG Tablet 1/2 to 1 tablet Orally Once a day as needed for edema, Notes: Dr. Drema Dallas   Meclizine HCl 25 MG Tablet 1/2n-1 tablet Orally three times a day as needed for veritgo, Notes: prn   Minastrin 24 FE 1mg /24mcg Tablet 1 tablet by mouth Once daily. Skip placebo pills of Packs 1 and 2. Take placebo pills of Pack 3., Notes: Aubrielle Stroud   Losartan Potassium 100 MG Tablet 1 tablet Orally Once a day   Spironolactone 25 MG Tablet 1 tablet with food Orally Once a day   Not-Taking   Protonix(Pantoprazole Sodium) 20 MG Tablet Delayed Release 2 tablets Orally Once a day, Notes: Rx'd in ER   Lysteda(Tranexamic Acid) 650 MG Tablet 2 tablets Orally TID up to 5 days with menses, Notes: Tadarius Maland   Medication List reviewed and reconciled with the patient    Past Medical History  Hypertension .   Edema.   Mother had ovarian cancer in her 77s.   Lower back pain due to pulled muscle 07/2014.   mild to moderate mitral regurgitation, mild LVH per echo 2016 (Dr. Wynonia Lawman).   R/o for Coagulapathy-family h/o DVT-W/u neg.   Mitral valve regurgitation.           Surgical History  tonsillectomy   foot surgery    Family History  Father: deceased, died from gun shot  Mother: deceased, passed away from Lung cancer, uterine cancer, was on Coumadin w/ cancer, diagnosed with Ovarian cancer  Sister 1: alive, blood clots, DVT at age 12  1 child with asthma, maternal  grandmother ?renal cancer.   Social History  General:  Tobacco use  cigarettes: Never smoked Tobacco history last updated 03/31/2017 no Alcohol.  Caffeine: yes, soda and coffee.  no Recreational drug use.  DIET: low sodium.  no Exercise, minimal.  Marital Status: married.  Children: girls, 1.  OCCUPATION: employed, [employer] .    Gyn History  Sexual activity currently sexually active.  Periods : 28 days.  LMP 03/27/2017.  Birth control Minastrin.  Last pap smear date 08/2013 pap neg, + HPV, 08/2014 all negative, 05/07/16, all negative.  Last mammogram date 02/2016.  Abnormal pap smear 1 + HPV on pap 08/2013, no bx's.    OB History  Number of pregnancies G3P1.  Pregnancy # 1 abortion.  Pregnancy # 2 abortion.  Pregnancy # 3 live birth, vaginal delivery.    Allergies  Keflex: "black out": Allergy  Lisinopril: cough: Side Effects   Hospitalization/Major Diagnostic Procedure  childbirth x 1 2007  Tonsillectomy 2001   Review of Systems  Denies fever/chills, chest pain, SOB, headaches, numbness/tingling. No h/o complication with anesthesia, bleeding disorders or blood clots.   Vital Signs  Wt 255, Wt change -6.2 lb, Ht 73.5, BMI 33.18, Pulse sitting 88, BP sitting 132/84.   Physical Examination  GENERAL:  Patient appears alert and oriented.  General Appearance: well-appearing, well-developed, no acute distress.  Speech: clear.  LUNGS:  Auscultation: no wheezing/rhonchi/rales. CTA  bilaterally.  HEART:  Heart sounds: normal. RRR.  Murmurs: grade 1/6.  ABDOMEN:  General: soft nontender, nondistended, no masses.  FEMALE GENITOURINARY:  Pelvic Vulva nl, vagina-scant blood, no fishy odor, Cervix normal. Uterus 10 week mobile, nontender.  EXTREMITIES:  General: No edema or calf tenderness.     Assessments   1. Pre-operative clearance - Z01.818 (Primary)   2. Menorrhagia with regular cycle - N92.0   3. 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 Pre-operative  clearance  Notes: Pt counseled on R/B/A of the planned procedure. Risk of injury to uterus discussed. Expectations of bleeding discussed. Pt informed that menses should lighten and may have amenorrhea but that is not guaranteed. Pt also informed that HTA may not continue due to open vessels due to myomectomy.    Follow Up  2 Weeks post op

## 2017-04-08 ENCOUNTER — Encounter (HOSPITAL_COMMUNITY)
Admission: RE | Disposition: A | Discharge status: Home / Self Care | Payer: Self-pay | Source: Ambulatory Visit | Attending: Obstetrics and Gynecology

## 2017-04-08 ENCOUNTER — Encounter (HOSPITAL_COMMUNITY): Payer: Self-pay

## 2017-04-08 ENCOUNTER — Ambulatory Visit (HOSPITAL_COMMUNITY): Payer: 59 | Admitting: Certified Registered Nurse Anesthetist

## 2017-04-08 ENCOUNTER — Ambulatory Visit (HOSPITAL_COMMUNITY)
Admission: RE | Admit: 2017-04-08 | Discharge: 2017-04-08 | Disposition: A | Discharge status: Home / Self Care | Payer: 59 | Source: Ambulatory Visit | Attending: Obstetrics and Gynecology | Admitting: Obstetrics and Gynecology

## 2017-04-08 DIAGNOSIS — I10 Essential (primary) hypertension: Secondary | ICD-10-CM | POA: Diagnosis not present

## 2017-04-08 DIAGNOSIS — Z8041 Family history of malignant neoplasm of ovary: Secondary | ICD-10-CM | POA: Diagnosis not present

## 2017-04-08 DIAGNOSIS — Z825 Family history of asthma and other chronic lower respiratory diseases: Secondary | ICD-10-CM | POA: Insufficient documentation

## 2017-04-08 DIAGNOSIS — I340 Nonrheumatic mitral (valve) insufficiency: Secondary | ICD-10-CM | POA: Insufficient documentation

## 2017-04-08 DIAGNOSIS — D250 Submucous leiomyoma of uterus: Secondary | ICD-10-CM | POA: Diagnosis not present

## 2017-04-08 DIAGNOSIS — N920 Excessive and frequent menstruation with regular cycle: Secondary | ICD-10-CM | POA: Diagnosis not present

## 2017-04-08 DIAGNOSIS — Z793 Long term (current) use of hormonal contraceptives: Secondary | ICD-10-CM | POA: Insufficient documentation

## 2017-04-08 DIAGNOSIS — Z801 Family history of malignant neoplasm of trachea, bronchus and lung: Secondary | ICD-10-CM | POA: Insufficient documentation

## 2017-04-08 DIAGNOSIS — Z79899 Other long term (current) drug therapy: Secondary | ICD-10-CM | POA: Diagnosis not present

## 2017-04-08 DIAGNOSIS — Z832 Family history of diseases of the blood and blood-forming organs and certain disorders involving the immune mechanism: Secondary | ICD-10-CM | POA: Diagnosis not present

## 2017-04-08 HISTORY — PX: HYSTEROSCOPY: SHX211

## 2017-04-08 HISTORY — PX: DILATATION & CURETTAGE/HYSTEROSCOPY WITH MYOSURE: SHX6511

## 2017-04-08 LAB — PREGNANCY, URINE: Preg Test, Ur: NEGATIVE

## 2017-04-08 SURGERY — DILATATION & CURETTAGE/HYSTEROSCOPY WITH MYOSURE
Anesthesia: General | Site: Vagina

## 2017-04-08 MED ORDER — LABETALOL HCL 5 MG/ML IV SOLN
5.0000 mg | Freq: Once | INTRAVENOUS | Status: AC
  Administered 2017-04-08: 5 mg via INTRAVENOUS

## 2017-04-08 MED ORDER — SILVER NITRATE-POT NITRATE 75-25 % EX MISC
CUTANEOUS | Status: AC
  Filled 2017-04-08: qty 1

## 2017-04-08 MED ORDER — MISOPROSTOL 100 MCG PO TABS
ORAL_TABLET | ORAL | Status: DC | PRN
  Administered 2017-04-08: 800 ug via RECTAL

## 2017-04-08 MED ORDER — SODIUM CHLORIDE 0.9 % IR SOLN
Status: DC | PRN
  Administered 2017-04-08: 3000 mL

## 2017-04-08 MED ORDER — LIDOCAINE HCL (CARDIAC) 20 MG/ML IV SOLN
INTRAVENOUS | Status: AC
  Filled 2017-04-08: qty 5

## 2017-04-08 MED ORDER — MIDAZOLAM HCL 2 MG/2ML IJ SOLN
INTRAMUSCULAR | Status: AC
  Filled 2017-04-08: qty 2

## 2017-04-08 MED ORDER — HYDROMORPHONE HCL 1 MG/ML IJ SOLN
0.2500 mg | INTRAMUSCULAR | Status: DC | PRN
  Administered 2017-04-08 (×3): 0.5 mg via INTRAVENOUS

## 2017-04-08 MED ORDER — DEXAMETHASONE SODIUM PHOSPHATE 4 MG/ML IJ SOLN
INTRAMUSCULAR | Status: AC
  Filled 2017-04-08: qty 1

## 2017-04-08 MED ORDER — PROPOFOL 10 MG/ML IV BOLUS
INTRAVENOUS | Status: DC | PRN
  Administered 2017-04-08: 200 mg via INTRAVENOUS

## 2017-04-08 MED ORDER — IBUPROFEN 600 MG PO TABS: 600.0000 mg | ORAL_TABLET | Freq: Four times a day (QID) | ORAL | 0 refills | Status: DC | PRN

## 2017-04-08 MED ORDER — HYDROMORPHONE HCL 1 MG/ML IJ SOLN
INTRAMUSCULAR | Status: AC
  Administered 2017-04-08: 0.5 mg via INTRAVENOUS
  Filled 2017-04-08: qty 1

## 2017-04-08 MED ORDER — EPHEDRINE SULFATE 50 MG/ML IJ SOLN
INTRAMUSCULAR | Status: DC | PRN
  Administered 2017-04-08 (×2): 10 mg via INTRAVENOUS
  Administered 2017-04-08 (×2): 5 mg via INTRAVENOUS
  Administered 2017-04-08: 10 mg via INTRAVENOUS

## 2017-04-08 MED ORDER — ONDANSETRON HCL 4 MG/2ML IJ SOLN
INTRAMUSCULAR | Status: DC | PRN
  Administered 2017-04-08: 4 mg via INTRAVENOUS

## 2017-04-08 MED ORDER — DEXAMETHASONE SODIUM PHOSPHATE 10 MG/ML IJ SOLN
INTRAMUSCULAR | Status: DC | PRN
  Administered 2017-04-08: 4 mg via INTRAVENOUS

## 2017-04-08 MED ORDER — KETOROLAC TROMETHAMINE 30 MG/ML IJ SOLN
INTRAMUSCULAR | Status: DC | PRN
  Administered 2017-04-08: 30 mg via INTRAVENOUS

## 2017-04-08 MED ORDER — SILVER NITRATE-POT NITRATE 75-25 % EX MISC
CUTANEOUS | Status: DC | PRN
  Administered 2017-04-08: 1

## 2017-04-08 MED ORDER — MISOPROSTOL 200 MCG PO TABS
ORAL_TABLET | ORAL | Status: AC
  Filled 2017-04-08: qty 5

## 2017-04-08 MED ORDER — ONDANSETRON HCL 4 MG/2ML IJ SOLN: 4.0000 mg | Freq: Once | INTRAMUSCULAR | Status: DC | PRN

## 2017-04-08 MED ORDER — SCOPOLAMINE 1 MG/3DAYS TD PT72
MEDICATED_PATCH | TRANSDERMAL | Status: AC
  Administered 2017-04-08: 1.5 mg via TRANSDERMAL
  Filled 2017-04-08: qty 1

## 2017-04-08 MED ORDER — LABETALOL HCL 5 MG/ML IV SOLN
INTRAVENOUS | Status: AC
  Filled 2017-04-08: qty 4

## 2017-04-08 MED ORDER — FENTANYL CITRATE (PF) 100 MCG/2ML IJ SOLN
INTRAMUSCULAR | Status: DC | PRN
  Administered 2017-04-08: 100 ug via INTRAVENOUS

## 2017-04-08 MED ORDER — OXYCODONE-ACETAMINOPHEN 5-325 MG PO TABS: 1.0000 | ORAL_TABLET | ORAL | 0 refills | Status: DC | PRN

## 2017-04-08 MED ORDER — SCOPOLAMINE 1 MG/3DAYS TD PT72
1.0000 | MEDICATED_PATCH | Freq: Once | TRANSDERMAL | Status: DC
  Administered 2017-04-08: 1.5 mg via TRANSDERMAL

## 2017-04-08 MED ORDER — LIDOCAINE HCL (CARDIAC) 20 MG/ML IV SOLN
INTRAVENOUS | Status: DC | PRN
Start: 2017-04-08 — End: 2017-04-08
  Administered 2017-04-08: 100 mg via INTRAVENOUS

## 2017-04-08 MED ORDER — MEPERIDINE HCL 25 MG/ML IJ SOLN: 6.2500 mg | INTRAMUSCULAR | Status: DC | PRN

## 2017-04-08 MED ORDER — MIDAZOLAM HCL 2 MG/2ML IJ SOLN
INTRAMUSCULAR | Status: DC | PRN
  Administered 2017-04-08: 2 mg via INTRAVENOUS

## 2017-04-08 MED ORDER — LIDOCAINE HCL 2 % IJ SOLN
INTRAMUSCULAR | Status: DC | PRN
  Administered 2017-04-08: 10 mL

## 2017-04-08 MED ORDER — LACTATED RINGERS IV SOLN
INTRAVENOUS | Status: DC
  Administered 2017-04-08 (×2): via INTRAVENOUS

## 2017-04-08 MED ORDER — KETOROLAC TROMETHAMINE 30 MG/ML IJ SOLN
INTRAMUSCULAR | Status: AC
  Filled 2017-04-08: qty 1

## 2017-04-08 MED ORDER — EPHEDRINE 5 MG/ML INJ
INTRAVENOUS | Status: AC
  Filled 2017-04-08: qty 10

## 2017-04-08 MED ORDER — PROPOFOL 10 MG/ML IV BOLUS
INTRAVENOUS | Status: AC
  Filled 2017-04-08: qty 20

## 2017-04-08 MED ORDER — ONDANSETRON HCL 4 MG/2ML IJ SOLN
INTRAMUSCULAR | Status: AC
  Filled 2017-04-08: qty 2

## 2017-04-08 MED ORDER — LIDOCAINE HCL 2 % IJ SOLN
INTRAMUSCULAR | Status: AC
  Filled 2017-04-08: qty 20

## 2017-04-08 MED ORDER — HYDROMORPHONE HCL 1 MG/ML IJ SOLN
INTRAMUSCULAR | Status: AC
  Filled 2017-04-08: qty 1

## 2017-04-08 MED ORDER — FENTANYL CITRATE (PF) 100 MCG/2ML IJ SOLN
INTRAMUSCULAR | Status: AC
  Filled 2017-04-08: qty 2

## 2017-04-08 SURGICAL SUPPLY — 21 items
CANISTER SUCT 3000ML PPV (MISCELLANEOUS) ×2 IMPLANT
CATH ROBINSON RED A/P 16FR (CATHETERS) ×2 IMPLANT
CLOTH BEACON ORANGE TIMEOUT ST (SAFETY) ×2 IMPLANT
CONTAINER PREFILL 10% NBF 60ML (FORM) ×4 IMPLANT
DEVICE MYOSURE LITE (MISCELLANEOUS) IMPLANT
DEVICE MYOSURE REACH (MISCELLANEOUS) IMPLANT
DILATOR CANAL MILEX (MISCELLANEOUS) IMPLANT
FILTER ARTHROSCOPY CONVERTOR (FILTER) ×2 IMPLANT
GLOVE BIO SURGEON STRL SZ7 (GLOVE) ×2 IMPLANT
GLOVE BIOGEL PI IND STRL 7.0 (GLOVE) ×2 IMPLANT
GLOVE BIOGEL PI INDICATOR 7.0 (GLOVE) ×2
GOWN STRL REUS W/TWL LRG LVL3 (GOWN DISPOSABLE) ×4 IMPLANT
MYOSURE XL FIBROID REM (MISCELLANEOUS) ×2
PACK VAGINAL MINOR WOMEN LF (CUSTOM PROCEDURE TRAY) ×2 IMPLANT
PAD OB MATERNITY 4.3X12.25 (PERSONAL CARE ITEMS) ×4 IMPLANT
SEAL ROD LENS SCOPE MYOSURE (ABLATOR) ×2 IMPLANT
SET GENESYS HTA PROCERVA (MISCELLANEOUS) ×2 IMPLANT
SYSTEM TISS REMOVAL MYSR XL RM (MISCELLANEOUS) ×1 IMPLANT
TOWEL OR 17X24 6PK STRL BLUE (TOWEL DISPOSABLE) ×4 IMPLANT
TUBING AQUILEX INFLOW (TUBING) ×2 IMPLANT
TUBING AQUILEX OUTFLOW (TUBING) ×2 IMPLANT

## 2017-04-08 NOTE — Anesthesia Procedure Notes (Signed)
Procedure Name: LMA Insertion Date/Time: 04/08/2017 7:29 AM Performed by: Bufford Spikes Pre-anesthesia Checklist: Patient identified, Emergency Drugs available, Suction available and Patient being monitored Patient Re-evaluated:Patient Re-evaluated prior to inductionOxygen Delivery Method: Circle system utilized Preoxygenation: Pre-oxygenation with 100% oxygen Intubation Type: IV induction Ventilation: Mask ventilation without difficulty LMA: LMA inserted LMA Size: 4.0 Number of attempts: 1 Airway Equipment and Method: Bite block Placement Confirmation: positive ETCO2 Tube secured with: Tape Dental Injury: Teeth and Oropharynx as per pre-operative assessment

## 2017-04-08 NOTE — Anesthesia Postprocedure Evaluation (Signed)
Anesthesia Post Note  Patient: Tonya Savage  Procedure(s) Performed: Procedure(s) (LRB): DILATATION & CURETTAGE/HYSTEROSCOPY WITH MYOSURE (N/A) HYSTEROSCOPY WITH HYDROTHERMAL ABLATION (N/A)  Patient location during evaluation: PACU Anesthesia Type: General Level of consciousness: awake and alert Pain management: pain level controlled Vital Signs Assessment: post-procedure vital signs reviewed and stable Respiratory status: spontaneous breathing, nonlabored ventilation, respiratory function stable and patient connected to nasal cannula oxygen Cardiovascular status: blood pressure returned to baseline and stable Postop Assessment: no signs of nausea or vomiting Anesthetic complications: no        Last Vitals:  Vitals:   04/08/17 1015 04/08/17 1050  BP: (!) 168/92 (!) 167/88  Pulse:  72  Resp: 14 16  Temp:  37 C    Last Pain:  Vitals:   04/08/17 1050  TempSrc: Oral  PainSc: 1    Pain Goal: Patients Stated Pain Goal: 3 (04/08/17 1050)               Karisa Nesser DAVID

## 2017-04-08 NOTE — Interval H&P Note (Signed)
History and Physical Interval Note:  04/08/2017 7:21 AM  Tonya Savage  has presented today for surgery, with the diagnosis of D25.0 Fibroids  The various methods of treatment have been discussed with the patient and family. After consideration of risks, benefits and other options for treatment, the patient has consented to  Procedure(s) with comments: [place] (N/A) -  Fibroids Dr. Simona Huh requests XL Myosure. Dr. Simona Huh will start with the [facility].  HYSTEROSCOPY WITH HYDROTHERMAL ABLATION (N/A) as a surgical intervention .  The patient's history has been reviewed, patient examined, no change in status, stable for surgery.  I have reviewed the patient's chart and labs.  Questions were answered to the patient's satisfaction.     Simona Huh, Adriano Bischof

## 2017-04-08 NOTE — Discharge Instructions (Addendum)
Hysteroscopy, Care After Refer to this sheet in the next few weeks. These instructions provide you with information on caring for yourself after your procedure. Your health care provider may also give you more specific instructions. Your treatment has been planned according to current medical practices, but problems sometimes occur. Call your health care provider if you have any problems or questions after your procedure. What can I expect after the procedure? After your procedure, it is typical to have the following:  You may have some cramping. This normally lasts for a couple days.  You may have bleeding. This can vary from light spotting for a few days to menstrual-like bleeding for 3-7 days. Follow these instructions at home:  Rest for the first 1-2 days after the procedure.  Only take over-the-counter or prescription medicines as directed by your health care provider. Do not take aspirin. It can increase the chances of bleeding.  Take showers instead of baths for 2 weeks or as directed by your health care provider.  Do not drive for 24 hours or as directed.  Do not drink alcohol while taking pain medicine.  Do not use tampons, douche, or have sexual intercourse for 2 weeks or until your health care provider says it is okay.  Take your temperature twice a day for 4-5 days. Write it down each time.  Follow your health care provider's advice about diet, exercise, and lifting.  If you develop constipation, you may:  Take a mild laxative if your health care provider approves.  Add bran foods to your diet.  Drink enough fluids to keep your urine clear or pale yellow.  Try to have someone with you or available to you for the first 24-48 hours, especially if you were given a general anesthetic.  Follow up with your health care provider as directed. Contact a health care provider if:  You feel dizzy or lightheaded.  You feel sick to your stomach (nauseous).  You have abnormal  vaginal discharge.  You have a rash.  You have pain that is not controlled with medicine. Get help right away if:  You have bleeding that is heavier than a normal menstrual period.  You have a fever.  You have increasing cramps or pain, not controlled with medicine.  You have new belly (abdominal) pain.  You pass out.  You have pain in the tops of your shoulders (shoulder strap areas).  You have shortness of breath.   This information is not intended to replace advice given to you by your health care provider. Make sure you discuss any questions you have with your health care provider.    Post Anesthesia Home Care Instructions  Activity: Get plenty of rest for the remainder of the day. A responsible individual must stay with you for 24 hours following the procedure.  For the next 24 hours, DO NOT: -Drive a car -Paediatric nurse -Drink alcoholic beverages -Take any medication unless instructed by your physician -Make any legal decisions or sign important papers.  Meals: Start with liquid foods such as gelatin or soup. Progress to regular foods as tolerated. Avoid greasy, spicy, heavy foods. If nausea and/or vomiting occur, drink only clear liquids until the nausea and/or vomiting subsides. Call your physician if vomiting continues.  Special Instructions/Symptoms: Your throat may feel dry or sore from the anesthesia or the breathing tube placed in your throat during surgery. If this causes discomfort, gargle with warm salt water. The discomfort should disappear within 24 hours.  If you had  a scopolamine patch placed behind your ear for the management of post- operative nausea and/or vomiting:  1. The medication in the patch is effective for 72 hours, after which it should be removed.  Wrap patch in a tissue and discard in the trash. Wash hands thoroughly with soap and water. 2. You may remove the patch earlier than 72 hours if you experience unpleasant side effects which  may include dry mouth, dizziness or visual disturbances. 3. Avoid touching the patch. Wash your hands with soap and water after contact with the patch.

## 2017-04-08 NOTE — Op Note (Signed)
NAME:  Tonya Savage, Tonya Savage             ACCOUNT NO.:  MEDICAL RECORD NO.:  [id number]  LOCATION:                                 FACILITY:  PHYSICIAN:  Jola Schmidt, MD        DATE OF BIRTH:  DATE OF PROCEDURE: DATE OF DISCHARGE:                              OPERATIVE REPORT   PREOPERATIVE DIAGNOSES:  Fibroids and menorrhagia.  POSTOPERATIVE DIAGNOSES:  Fibroids and menorrhagia.  PROCEDURE:  Hysteroscopic myomectomy via MyoSure, HTA endometrial ablation, and curettage of the uterus.  SURGEON:  Raul Del. Simona Huh, MD.  ASSISTANT:  None.  ANESTHESIA:  Local and general.  ESTIMATED BLOOD LOSS:  Minimal.  URINE:  100 mL at the beginning of the case.  BLOOD ADMINISTERED:  None.  DRAINS:  None.  MEDICATIONS:  Local with 2% lidocaine 10 mL, Cytotec 800 mcg placed per rectum at end of procedure.  SPECIMENS:  Fibroids and endometrial curettings.  DISPOSITION OF SPECIMENS:  To Pathology.  COUNTS CORRECT:  Yes.  DISPOSITION:  The patient to PACU, hemodynamically stable.  COMPLICATIONS:  None.  FINDING:  Two large submucosal fibroids.  Dominant fibroid occupying the lower uterine segment.  Endometrium appeared normal with normal ostia. Cervix normal.  PROCEDURE IN DETAIL:  Ms. Tonya Savage was identified in the holding area.  She was then taken to the operating room with IV running.  She was placed in the dorsal lithotomy position and underwent general endotracheal anesthesia.  She was prepped and draped in a normal sterile fashion.  The hysteroscope was primed and dried.  A time-out was performed.  SCDs were on the patient's legs and they were operating prior to and on the procedure.  She did not receive antibiotics prior to the surgery.  A Graves speculum was inserted into the vagina.  The cervix was visualized.  Anterior lip of the cervix was injected with 2% lidocaine. A single-tooth tenaculum was applied and a paracervical block was completed for a total  of 10 mL of lidocaine.  The cervix was easily dilated to an 8 with 8 Hegar.  The scope was then advanced; the findings above were noted.  The MyoSure was introduced into the cavity and we began the ablation.  Then, the cavity got very bloody.  I removed the hysteroscope.  There was no water flowing through the camera.  The rep was available and troubleshooted and the canisters were readjusted to allow the suction to continue properly.  The device was then working appropriately.  It was then reintroduced back into the cavity.  The fibroids were easily resected with a deficit of approximately 800.  They were resected down to the level of the endometrium with a very good result.  That hysteroscope was removed.  The HTA was then assembled and primed. Then, it was introduced.  We had some technical difficulties the first it was primed, and during the priming, the screen was flashing on and off.  The HTA rep was also present and thought it was best to get a new device, which that was done.  When the new device came, it did not flash, but it had a cassette error, so a new cassette was obtained.  So, finally we were able to prime.  Because of dilating to 8, I suspected leakage, so a second tenaculum was placed.  During the leak test, there was more leakage than was appropriate, so I have readjusted it so it was a very tight seal.  The HTA ablation was then completed without issue for the full 10 minutes, no alerts.  Once the ablation was completed, the tenaculum was released on the posterior cervix and the camera was removed.  The curettage of the uterus was performed for pathology. There was some oozing noted from the cervix.  The single-tooth tenaculum sites were made hemostatic with silver nitrate.  There was still oozing from inside the cervix, and due to the myomectomy, 800 Cytotec was placed in the rectum with lubricant to help manage the bleeding.  The patient also got Toradol prior to  awakening.  All instrument, sponge, and needle counts were correct x3.  The patient tolerated the procedure well.  She was awakened in the operating room and taken to the recovery room in stable condition.     Jola Schmidt, MD     EBV/MEDQ  D:  04/08/2017  T:  04/08/2017  Job:  [id number]

## 2017-04-08 NOTE — Brief Op Note (Signed)
04/08/2017  9:02 AM  PATIENT:  Tonya Savage 69  46 y.o. female  PRE-OPERATIVE DIAGNOSIS:  D25.0 Fibroids, Menorrhagia  POST-OPERATIVE DIAGNOSIS:  Same  PROCEDURE:  Procedure(s) with comments: DILATATION & CURETTAGE/HYSTEROSCOPY WITH MYOSURE (N/A) -  Fibroids Dr. Simona Huh requests XL Myosure. Dr. Simona Huh will start with the [facility].  HYSTEROSCOPY WITH HYDROTHERMAL ABLATION (N/A)  SURGEON:  Surgeon(s) and Role:    * Thurnell Lose, MD - Primary  PHYSICIAN ASSISTANT: None  ASSISTANTS: none   ANESTHESIA:   local and general  EBL:  Total I/O In: 1000 [I.V.:1000] Out: 110 [Urine:100; Blood:10]  BLOOD ADMINISTERED:none  DRAINS: none   LOCAL MEDICATIONS USED:  2 % LIDOCAINE  and Amount: 10 ml.  Cytotec 800 mcg placed PR at end of procedure  SPECIMEN:  Source of Specimen:  Fibroids, endometrial currettings  DISPOSITION OF SPECIMEN:  PATHOLOGY  COUNTS:  YES  TOURNIQUET:  * No tourniquets in log *  DICTATION: .Other Dictation: Dictation Number [id number]  PLAN OF CARE: Discharge to home after PACU  PATIENT DISPOSITION:  PACU - hemodynamically stable.   Delay start of Pharmacological VTE agent (>24hrs) due to surgical blood loss or risk of bleeding: yes

## 2017-04-08 NOTE — Anesthesia Preprocedure Evaluation (Signed)
Anesthesia Evaluation  Patient identified by MRN, date of birth, ID band Patient awake    Reviewed: Allergy & Precautions, NPO status , Patient's Chart, lab work & pertinent test results  Airway Mallampati: I  TM Distance: >3 FB Neck ROM: Full    Dental   Pulmonary    Pulmonary exam normal        Cardiovascular hypertension, Pt. on medications Normal cardiovascular exam+ Valvular Problems/Murmurs      Neuro/Psych    GI/Hepatic   Endo/Other    Renal/GU      Musculoskeletal   Abdominal   Peds  Hematology   Anesthesia Other Findings   Reproductive/Obstetrics                             Anesthesia Physical Anesthesia Plan  ASA: II  Anesthesia Plan: General   Post-op Pain Management:    Induction: Intravenous  Airway Management Planned: Oral ETT  Additional Equipment:   Intra-op Plan:   Post-operative Plan: Extubation in OR  Informed Consent: I have reviewed the patients History and Physical, chart, labs and discussed the procedure including the risks, benefits and alternatives for the proposed anesthesia with the patient or authorized representative who has indicated his/her understanding and acceptance.     Plan Discussed with: CRNA and Surgeon  Anesthesia Plan Comments:         Anesthesia Quick Evaluation

## 2017-04-08 NOTE — Transfer of Care (Signed)
Immediate Anesthesia Transfer of Care Note  Patient: Tonya Savage  Procedure(s) Performed: Procedure(s) with comments: DILATATION & CURETTAGE/HYSTEROSCOPY WITH MYOSURE (N/A) -  Fibroids Dr. Simona Huh requests XL Myosure. Dr. Simona Huh will start with the [facility].  HYSTEROSCOPY WITH HYDROTHERMAL ABLATION (N/A)  Patient Location: PACU  Anesthesia Type:General  Level of Consciousness: awake, alert  and oriented  Airway & Oxygen Therapy: Patient Spontanous Breathing and Patient connected to nasal cannula oxygen  Post-op Assessment: Report given to RN and Post -op Vital signs reviewed and stable  Post vital signs: Reviewed and stable  Last Vitals:  Vitals:   04/08/17 0608  BP: 140/89  Resp: 16  Temp: 36.7 C    Last Pain:  Vitals:   04/08/17 0608  TempSrc: Oral         Complications: No apparent anesthesia complications

## 2017-04-09 ENCOUNTER — Encounter (HOSPITAL_COMMUNITY): Payer: Self-pay | Admitting: Obstetrics and Gynecology

## 2017-05-14 DIAGNOSIS — M62830 Muscle spasm of back: Secondary | ICD-10-CM | POA: Diagnosis not present

## 2017-05-20 ENCOUNTER — Other Ambulatory Visit: Payer: Self-pay | Admitting: Family Medicine

## 2017-05-20 DIAGNOSIS — Z1231 Encounter for screening mammogram for malignant neoplasm of breast: Secondary | ICD-10-CM

## 2017-05-22 DIAGNOSIS — M6281 Muscle weakness (generalized): Secondary | ICD-10-CM | POA: Diagnosis not present

## 2017-05-22 DIAGNOSIS — M545 Low back pain: Secondary | ICD-10-CM | POA: Diagnosis not present

## 2017-05-22 DIAGNOSIS — R269 Unspecified abnormalities of gait and mobility: Secondary | ICD-10-CM | POA: Diagnosis not present

## 2017-05-25 DIAGNOSIS — M6281 Muscle weakness (generalized): Secondary | ICD-10-CM | POA: Diagnosis not present

## 2017-05-25 DIAGNOSIS — M545 Low back pain: Secondary | ICD-10-CM | POA: Diagnosis not present

## 2017-05-25 DIAGNOSIS — R269 Unspecified abnormalities of gait and mobility: Secondary | ICD-10-CM | POA: Diagnosis not present

## 2017-05-29 DIAGNOSIS — M6281 Muscle weakness (generalized): Secondary | ICD-10-CM | POA: Diagnosis not present

## 2017-05-29 DIAGNOSIS — R269 Unspecified abnormalities of gait and mobility: Secondary | ICD-10-CM | POA: Diagnosis not present

## 2017-05-29 DIAGNOSIS — M545 Low back pain: Secondary | ICD-10-CM | POA: Diagnosis not present

## 2017-06-03 DIAGNOSIS — M6281 Muscle weakness (generalized): Secondary | ICD-10-CM | POA: Diagnosis not present

## 2017-06-03 DIAGNOSIS — R269 Unspecified abnormalities of gait and mobility: Secondary | ICD-10-CM | POA: Diagnosis not present

## 2017-06-03 DIAGNOSIS — M545 Low back pain: Secondary | ICD-10-CM | POA: Diagnosis not present

## 2017-06-05 DIAGNOSIS — M6281 Muscle weakness (generalized): Secondary | ICD-10-CM | POA: Diagnosis not present

## 2017-06-05 DIAGNOSIS — M545 Low back pain: Secondary | ICD-10-CM | POA: Diagnosis not present

## 2017-06-05 DIAGNOSIS — R269 Unspecified abnormalities of gait and mobility: Secondary | ICD-10-CM | POA: Diagnosis not present

## 2017-06-08 DIAGNOSIS — R269 Unspecified abnormalities of gait and mobility: Secondary | ICD-10-CM | POA: Diagnosis not present

## 2017-06-08 DIAGNOSIS — M545 Low back pain: Secondary | ICD-10-CM | POA: Diagnosis not present

## 2017-06-08 DIAGNOSIS — M6281 Muscle weakness (generalized): Secondary | ICD-10-CM | POA: Diagnosis not present

## 2017-06-10 DIAGNOSIS — M6281 Muscle weakness (generalized): Secondary | ICD-10-CM | POA: Diagnosis not present

## 2017-06-10 DIAGNOSIS — M545 Low back pain: Secondary | ICD-10-CM | POA: Diagnosis not present

## 2017-06-10 DIAGNOSIS — R269 Unspecified abnormalities of gait and mobility: Secondary | ICD-10-CM | POA: Diagnosis not present

## 2017-06-12 DIAGNOSIS — M6281 Muscle weakness (generalized): Secondary | ICD-10-CM | POA: Diagnosis not present

## 2017-06-12 DIAGNOSIS — R269 Unspecified abnormalities of gait and mobility: Secondary | ICD-10-CM | POA: Diagnosis not present

## 2017-06-12 DIAGNOSIS — M545 Low back pain: Secondary | ICD-10-CM | POA: Diagnosis not present

## 2017-06-15 ENCOUNTER — Ambulatory Visit
Admission: RE | Admit: 2017-06-15 | Discharge: 2017-06-15 | Disposition: A | Discharge status: Home / Self Care | Payer: 59 | Source: Ambulatory Visit | Attending: Family Medicine | Admitting: Family Medicine

## 2017-06-15 DIAGNOSIS — Z1231 Encounter for screening mammogram for malignant neoplasm of breast: Secondary | ICD-10-CM | POA: Diagnosis not present

## 2017-06-16 DIAGNOSIS — R269 Unspecified abnormalities of gait and mobility: Secondary | ICD-10-CM | POA: Diagnosis not present

## 2017-06-16 DIAGNOSIS — M6281 Muscle weakness (generalized): Secondary | ICD-10-CM | POA: Diagnosis not present

## 2017-06-16 DIAGNOSIS — M545 Low back pain: Secondary | ICD-10-CM | POA: Diagnosis not present

## 2017-06-19 DIAGNOSIS — R269 Unspecified abnormalities of gait and mobility: Secondary | ICD-10-CM | POA: Diagnosis not present

## 2017-06-19 DIAGNOSIS — M6281 Muscle weakness (generalized): Secondary | ICD-10-CM | POA: Diagnosis not present

## 2017-06-19 DIAGNOSIS — M545 Low back pain: Secondary | ICD-10-CM | POA: Diagnosis not present

## 2017-06-22 DIAGNOSIS — R269 Unspecified abnormalities of gait and mobility: Secondary | ICD-10-CM | POA: Diagnosis not present

## 2017-06-22 DIAGNOSIS — M6281 Muscle weakness (generalized): Secondary | ICD-10-CM | POA: Diagnosis not present

## 2017-06-22 DIAGNOSIS — M545 Low back pain: Secondary | ICD-10-CM | POA: Diagnosis not present

## 2017-06-23 DIAGNOSIS — R269 Unspecified abnormalities of gait and mobility: Secondary | ICD-10-CM | POA: Diagnosis not present

## 2017-06-23 DIAGNOSIS — M545 Low back pain: Secondary | ICD-10-CM | POA: Diagnosis not present

## 2017-06-23 DIAGNOSIS — M6281 Muscle weakness (generalized): Secondary | ICD-10-CM | POA: Diagnosis not present

## 2017-07-01 DIAGNOSIS — M545 Low back pain: Secondary | ICD-10-CM | POA: Diagnosis not present

## 2017-07-01 DIAGNOSIS — M6281 Muscle weakness (generalized): Secondary | ICD-10-CM | POA: Diagnosis not present

## 2017-07-01 DIAGNOSIS — R269 Unspecified abnormalities of gait and mobility: Secondary | ICD-10-CM | POA: Diagnosis not present

## 2017-07-03 DIAGNOSIS — R269 Unspecified abnormalities of gait and mobility: Secondary | ICD-10-CM | POA: Diagnosis not present

## 2017-07-03 DIAGNOSIS — M6281 Muscle weakness (generalized): Secondary | ICD-10-CM | POA: Diagnosis not present

## 2017-07-03 DIAGNOSIS — M545 Low back pain: Secondary | ICD-10-CM | POA: Diagnosis not present

## 2017-07-06 DIAGNOSIS — M6281 Muscle weakness (generalized): Secondary | ICD-10-CM | POA: Diagnosis not present

## 2017-07-06 DIAGNOSIS — R269 Unspecified abnormalities of gait and mobility: Secondary | ICD-10-CM | POA: Diagnosis not present

## 2017-07-06 DIAGNOSIS — M545 Low back pain: Secondary | ICD-10-CM | POA: Diagnosis not present

## 2017-07-09 DIAGNOSIS — M6281 Muscle weakness (generalized): Secondary | ICD-10-CM | POA: Diagnosis not present

## 2017-07-09 DIAGNOSIS — R269 Unspecified abnormalities of gait and mobility: Secondary | ICD-10-CM | POA: Diagnosis not present

## 2017-07-09 DIAGNOSIS — M545 Low back pain: Secondary | ICD-10-CM | POA: Diagnosis not present

## 2017-07-14 DIAGNOSIS — M6281 Muscle weakness (generalized): Secondary | ICD-10-CM | POA: Diagnosis not present

## 2017-07-14 DIAGNOSIS — R269 Unspecified abnormalities of gait and mobility: Secondary | ICD-10-CM | POA: Diagnosis not present

## 2017-07-14 DIAGNOSIS — M545 Low back pain: Secondary | ICD-10-CM | POA: Diagnosis not present

## 2017-07-17 DIAGNOSIS — M6281 Muscle weakness (generalized): Secondary | ICD-10-CM | POA: Diagnosis not present

## 2017-07-17 DIAGNOSIS — R269 Unspecified abnormalities of gait and mobility: Secondary | ICD-10-CM | POA: Diagnosis not present

## 2017-07-17 DIAGNOSIS — M545 Low back pain: Secondary | ICD-10-CM | POA: Diagnosis not present

## 2017-07-21 DIAGNOSIS — M545 Low back pain: Secondary | ICD-10-CM | POA: Diagnosis not present

## 2017-07-21 DIAGNOSIS — M6281 Muscle weakness (generalized): Secondary | ICD-10-CM | POA: Diagnosis not present

## 2017-07-21 DIAGNOSIS — R269 Unspecified abnormalities of gait and mobility: Secondary | ICD-10-CM | POA: Diagnosis not present

## 2017-07-23 DIAGNOSIS — M545 Low back pain: Secondary | ICD-10-CM | POA: Diagnosis not present

## 2017-07-23 DIAGNOSIS — M6281 Muscle weakness (generalized): Secondary | ICD-10-CM | POA: Diagnosis not present

## 2017-07-23 DIAGNOSIS — R269 Unspecified abnormalities of gait and mobility: Secondary | ICD-10-CM | POA: Diagnosis not present

## 2017-09-04 DIAGNOSIS — Z131 Encounter for screening for diabetes mellitus: Secondary | ICD-10-CM | POA: Diagnosis not present

## 2017-09-04 DIAGNOSIS — Z13220 Encounter for screening for lipoid disorders: Secondary | ICD-10-CM | POA: Diagnosis not present

## 2017-10-07 IMAGING — DX DG CHEST 2V
2 series · 2 of 2 positions shown · non-contrast
Comparison: October 24, 2015

CLINICAL DATA: Chest pain starting today

EXAM:
CHEST  2 VIEW

[w chest pa]
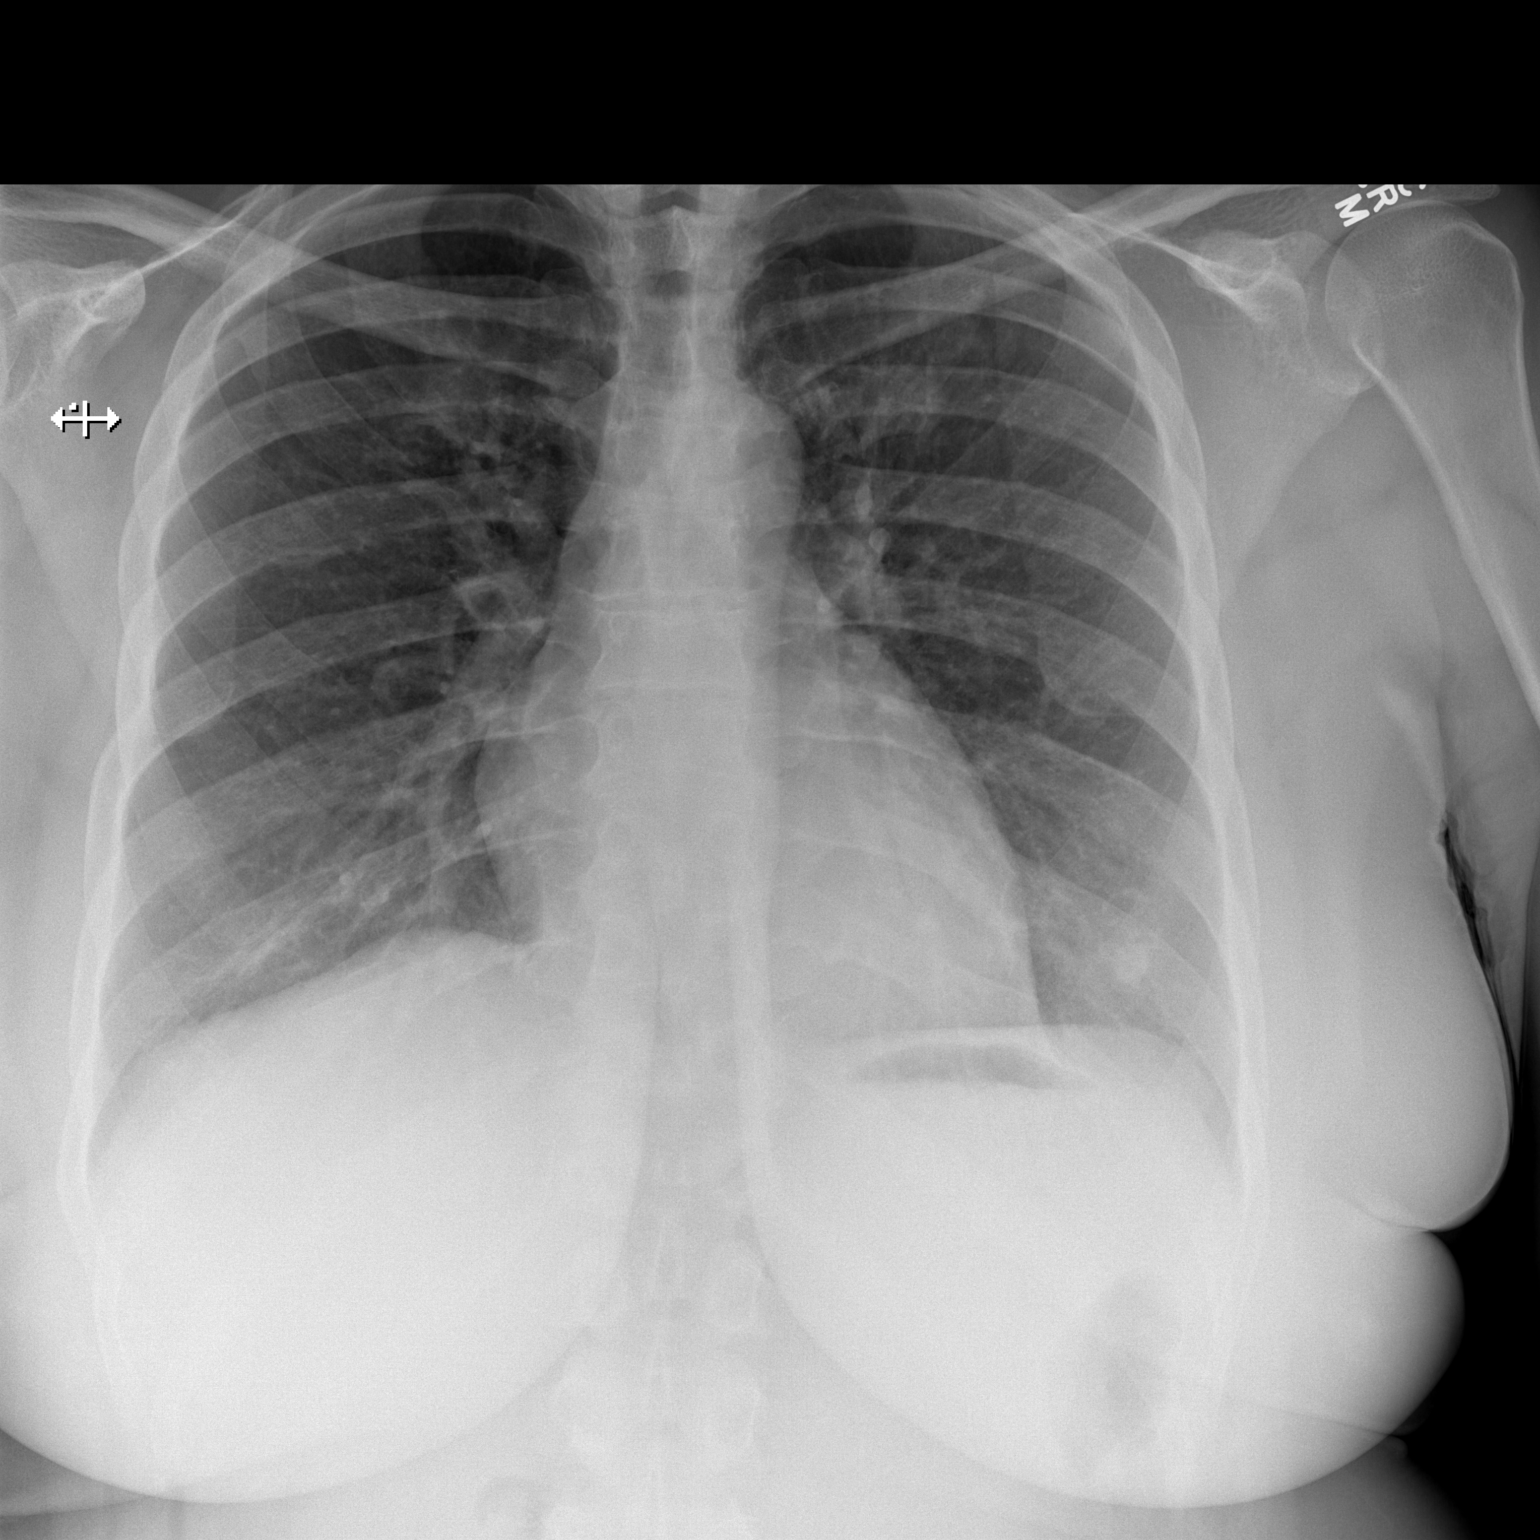

[w chest lat]
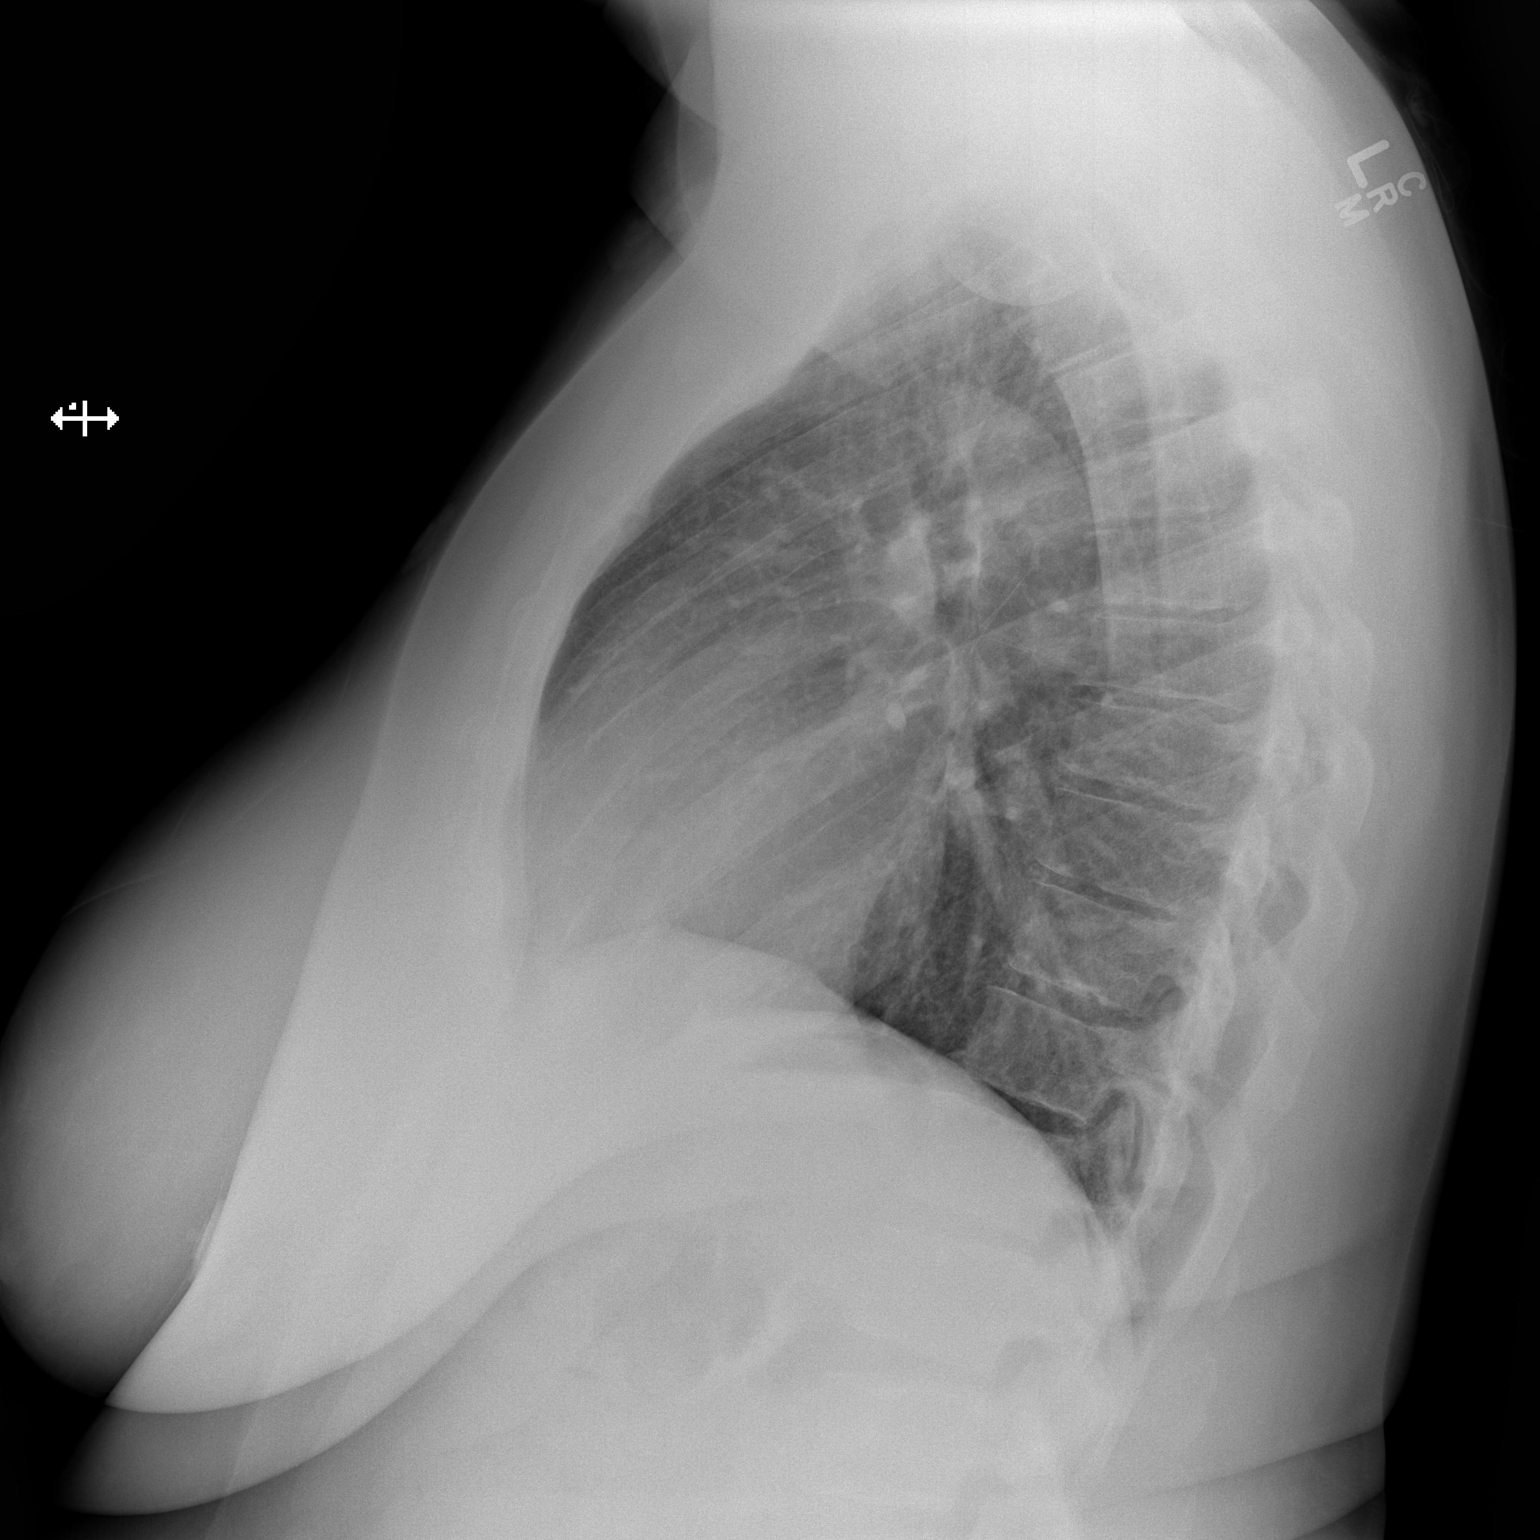

[2 of 2 positions shown; findings below may reference images not displayed]

FINDINGS: The heart size and mediastinal contours are within normal limits.
Both lungs are clear. Nipple shadow is identified over left lung
base unchanged. The visualized skeletal structures are unremarkable.
IMPRESSION: No active cardiopulmonary disease.

## 2017-10-24 DIAGNOSIS — Z23 Encounter for immunization: Secondary | ICD-10-CM | POA: Diagnosis not present

## 2017-11-16 DIAGNOSIS — I340 Nonrheumatic mitral (valve) insufficiency: Secondary | ICD-10-CM | POA: Diagnosis not present

## 2017-11-16 DIAGNOSIS — I349 Nonrheumatic mitral valve disorder, unspecified: Secondary | ICD-10-CM | POA: Diagnosis not present

## 2017-11-16 DIAGNOSIS — I119 Hypertensive heart disease without heart failure: Secondary | ICD-10-CM | POA: Diagnosis not present

## 2018-01-04 DIAGNOSIS — Z79899 Other long term (current) drug therapy: Secondary | ICD-10-CM | POA: Diagnosis not present

## 2018-01-04 DIAGNOSIS — I10 Essential (primary) hypertension: Secondary | ICD-10-CM | POA: Diagnosis not present

## 2018-03-21 DIAGNOSIS — Z79899 Other long term (current) drug therapy: Secondary | ICD-10-CM | POA: Diagnosis not present

## 2018-03-21 DIAGNOSIS — R5383 Other fatigue: Secondary | ICD-10-CM | POA: Diagnosis not present

## 2018-03-21 DIAGNOSIS — R0602 Shortness of breath: Secondary | ICD-10-CM | POA: Diagnosis not present

## 2018-04-20 DIAGNOSIS — Z79899 Other long term (current) drug therapy: Secondary | ICD-10-CM | POA: Diagnosis not present

## 2018-05-20 DIAGNOSIS — Z79899 Other long term (current) drug therapy: Secondary | ICD-10-CM | POA: Diagnosis not present

## 2018-06-08 DIAGNOSIS — I119 Hypertensive heart disease without heart failure: Secondary | ICD-10-CM | POA: Diagnosis not present

## 2018-06-08 DIAGNOSIS — I340 Nonrheumatic mitral (valve) insufficiency: Secondary | ICD-10-CM | POA: Diagnosis not present

## 2018-06-16 DIAGNOSIS — Z79899 Other long term (current) drug therapy: Secondary | ICD-10-CM | POA: Diagnosis not present

## 2018-06-22 ENCOUNTER — Other Ambulatory Visit: Payer: Self-pay | Admitting: Family Medicine

## 2018-06-22 DIAGNOSIS — Z1231 Encounter for screening mammogram for malignant neoplasm of breast: Secondary | ICD-10-CM

## 2018-07-19 ENCOUNTER — Ambulatory Visit
Admission: RE | Admit: 2018-07-19 | Discharge: 2018-07-19 | Disposition: A | Discharge status: Home / Self Care | Payer: 59 | Source: Ambulatory Visit | Attending: Family Medicine | Admitting: Family Medicine

## 2018-07-19 DIAGNOSIS — Z1231 Encounter for screening mammogram for malignant neoplasm of breast: Secondary | ICD-10-CM

## 2018-07-19 DIAGNOSIS — Z79899 Other long term (current) drug therapy: Secondary | ICD-10-CM | POA: Diagnosis not present

## 2018-07-19 DIAGNOSIS — I10 Essential (primary) hypertension: Secondary | ICD-10-CM | POA: Diagnosis not present

## 2018-07-19 DIAGNOSIS — R609 Edema, unspecified: Secondary | ICD-10-CM | POA: Diagnosis not present

## 2018-07-26 DIAGNOSIS — E878 Other disorders of electrolyte and fluid balance, not elsewhere classified: Secondary | ICD-10-CM | POA: Diagnosis not present

## 2018-08-11 DIAGNOSIS — Z79899 Other long term (current) drug therapy: Secondary | ICD-10-CM | POA: Diagnosis not present

## 2018-08-11 DIAGNOSIS — R635 Abnormal weight gain: Secondary | ICD-10-CM | POA: Diagnosis not present

## 2018-08-30 DIAGNOSIS — H04123 Dry eye syndrome of bilateral lacrimal glands: Secondary | ICD-10-CM | POA: Diagnosis not present

## 2018-09-07 DIAGNOSIS — Z01419 Encounter for gynecological examination (general) (routine) without abnormal findings: Secondary | ICD-10-CM | POA: Diagnosis not present

## 2018-09-10 DIAGNOSIS — R635 Abnormal weight gain: Secondary | ICD-10-CM | POA: Diagnosis not present

## 2018-09-10 DIAGNOSIS — E7800 Pure hypercholesterolemia, unspecified: Secondary | ICD-10-CM | POA: Diagnosis not present

## 2018-09-10 DIAGNOSIS — E8881 Metabolic syndrome: Secondary | ICD-10-CM | POA: Diagnosis not present

## 2018-09-10 DIAGNOSIS — N915 Oligomenorrhea, unspecified: Secondary | ICD-10-CM | POA: Diagnosis not present

## 2018-09-10 DIAGNOSIS — R5383 Other fatigue: Secondary | ICD-10-CM | POA: Diagnosis not present

## 2018-09-10 DIAGNOSIS — Z79899 Other long term (current) drug therapy: Secondary | ICD-10-CM | POA: Diagnosis not present

## 2018-10-07 DIAGNOSIS — N915 Oligomenorrhea, unspecified: Secondary | ICD-10-CM | POA: Diagnosis not present

## 2018-10-07 DIAGNOSIS — R635 Abnormal weight gain: Secondary | ICD-10-CM | POA: Diagnosis not present

## 2018-10-07 DIAGNOSIS — I10 Essential (primary) hypertension: Secondary | ICD-10-CM | POA: Diagnosis not present

## 2018-10-07 DIAGNOSIS — Z79899 Other long term (current) drug therapy: Secondary | ICD-10-CM | POA: Diagnosis not present

## 2018-10-07 DIAGNOSIS — E7800 Pure hypercholesterolemia, unspecified: Secondary | ICD-10-CM | POA: Diagnosis not present

## 2018-11-18 DIAGNOSIS — E8881 Metabolic syndrome: Secondary | ICD-10-CM | POA: Diagnosis not present

## 2018-11-18 DIAGNOSIS — R635 Abnormal weight gain: Secondary | ICD-10-CM | POA: Diagnosis not present

## 2018-11-18 DIAGNOSIS — N915 Oligomenorrhea, unspecified: Secondary | ICD-10-CM | POA: Diagnosis not present

## 2018-11-18 DIAGNOSIS — Z79899 Other long term (current) drug therapy: Secondary | ICD-10-CM | POA: Diagnosis not present

## 2018-11-18 DIAGNOSIS — I10 Essential (primary) hypertension: Secondary | ICD-10-CM | POA: Diagnosis not present

## 2018-11-24 DIAGNOSIS — R102 Pelvic and perineal pain: Secondary | ICD-10-CM | POA: Diagnosis not present

## 2018-11-24 DIAGNOSIS — Z30431 Encounter for routine checking of intrauterine contraceptive device: Secondary | ICD-10-CM | POA: Diagnosis not present

## 2018-11-24 DIAGNOSIS — R030 Elevated blood-pressure reading, without diagnosis of hypertension: Secondary | ICD-10-CM | POA: Diagnosis not present

## 2018-12-21 ENCOUNTER — Ambulatory Visit: Payer: 59 | Admitting: Cardiology

## 2018-12-21 ENCOUNTER — Encounter: Payer: Self-pay | Admitting: Cardiology

## 2018-12-21 DIAGNOSIS — Z13220 Encounter for screening for lipoid disorders: Secondary | ICD-10-CM

## 2018-12-21 DIAGNOSIS — I10 Essential (primary) hypertension: Secondary | ICD-10-CM

## 2018-12-21 DIAGNOSIS — R011 Cardiac murmur, unspecified: Secondary | ICD-10-CM

## 2018-12-21 HISTORY — DX: Essential (primary) hypertension: I10

## 2018-12-21 HISTORY — DX: Cardiac murmur, unspecified: R01.1

## 2018-12-21 HISTORY — DX: Encounter for screening for lipoid disorders: Z13.220

## 2018-12-21 NOTE — Patient Instructions (Signed)
Medication Instructions:  Your physician recommends that you continue on your current medications as directed. Please refer to the Current Medication list given to you today.  If you need a refill on your cardiac medications before your next appointment, please call your pharmacy.   Lab work: Your physician recommends that you return for lab work in: Cbc, tsh, bmp, lft, and lipids.  If you have labs (blood work) drawn today and your tests are completely normal, you will receive your results only by: Marland Kitchen MyChart Message (if you have MyChart) OR . A paper copy in the mail If you have any lab test that is abnormal or we need to change your treatment, we will call you to review the results.  Testing/Procedures: Your physician has requested that you have an echocardiogram. Echocardiography is a painless test that uses sound waves to create images of your heart. It provides your doctor with information about the size and shape of your heart and how well your heart's chambers and valves are working. This procedure takes approximately one hour. There are no restrictions for this procedure.    Follow-Up: At [facility], you and your health needs are our priority.  As part of our continuing mission to provide you with exceptional heart care, we have created designated Provider Care Teams.  These Care Teams include your primary Cardiologist (physician) and Advanced Practice Providers (APPs -  Physician Assistants and Nurse Practitioners) who all work together to provide you with the care you need, when you need it. You will need a follow up appointment in 9 months.  Please call our office 2 months in advance to schedule this appointment.  You may see No primary care provider on file. or another member of our [employer] in [place]: Jenne Campus, MD . Shirlee More, MD  Any Other Special Instructions Will Be Listed Below (If Applicable).   Echocardiogram An echocardiogram is a  procedure that uses painless sound waves (ultrasound) to produce an image of the heart. Images from an echocardiogram can provide important information about:  Signs of coronary artery disease (CAD).  Aneurysm detection. An aneurysm is a weak or damaged part of an artery wall that bulges out from the normal force of blood pumping through the body.  Heart size and shape. Changes in the size or shape of the heart can be associated with certain conditions, including heart failure, aneurysm, and CAD.  Heart muscle function.  Heart valve function.  Signs of a past heart attack.  Fluid buildup around the heart.  Thickening of the heart muscle.  A tumor or infectious growth around the heart valves. Tell a health care provider about:  Any allergies you have.  All medicines you are taking, including vitamins, herbs, eye drops, creams, and over-the-counter medicines.  Any blood disorders you have.  Any surgeries you have had.  Any medical conditions you have.  Whether you are pregnant or may be pregnant. What are the risks? Generally, this is a safe procedure. However, problems may occur, including:  Allergic reaction to dye (contrast) that may be used during the procedure. What happens before the procedure? No specific preparation is needed. You may eat and drink normally. What happens during the procedure?   An IV tube may be inserted into one of your veins.  You may receive contrast through this tube. A contrast is an injection that improves the quality of the pictures from your heart.  A gel will be applied to your chest.  A wand-like tool (transducer) will be moved over your chest. The gel will help to transmit the sound waves from the transducer.  The sound waves will harmlessly bounce off of your heart to allow the heart images to be captured in real-time motion. The images will be recorded on a computer. The procedure may vary among health care providers and  hospitals. What happens after the procedure?  You may return to your normal, everyday life, including diet, activities, and medicines, unless your health care provider tells you not to do that. Summary  An echocardiogram is a procedure that uses painless sound waves (ultrasound) to produce an image of the heart.  Images from an echocardiogram can provide important information about the size and shape of your heart, heart muscle function, heart valve function, and fluid buildup around your heart.  You do not need to do anything to prepare before this procedure. You may eat and drink normally.  After the echocardiogram is completed, you may return to your normal, everyday life, unless your health care provider tells you not to do that. This information is not intended to replace advice given to you by your health care provider. Make sure you discuss any questions you have with your health care provider. Document Released: 11/28/2000 Document Revised: 01/03/2017 Document Reviewed: 01/03/2017 Elsevier Interactive Patient Education  2019 [employer].

## 2018-12-21 NOTE — Progress Notes (Signed)
Cardiology Office Note:    Date:  12/21/2018   ID:  Tonya Savage, DOB February 23, 1971, MRN [id number]  PCP:  Leighton Ruff, MD  Cardiologist:  Jenean Lindau, MD   Referring MD: Leighton Ruff, MD    ASSESSMENT:    1. Essential hypertension   2. Screening for cholesterol level   3. Cardiac murmur    PLAN:    In order of problems listed above:  1. Primary prevention stressed with the patient.  Importance of compliance with diet and medication stressed and she vocalized understanding.  Her blood pressure is stable and diet was discussed for dyslipidemia and diabetes mellitus.  She is fasting today so she will have all blood work done including lipids and a Chem-7. 2. Patient will be seen in follow-up appointment in 9 months or earlier if the patient has any concerns.  Diet was discussed for obesity and weight reduction was stressed at length. 3. Echocardiogram will be done to assess murmur heard on auscultation and to assess her history of mitral regurgitation.   Medication Adjustments/Labs and Tests Ordered: Current medicines are reviewed at length with the patient today.  Concerns regarding medicines are outlined above.  No orders of the defined types were placed in this encounter.  No orders of the defined types were placed in this encounter.    No chief complaint on file.    History of Present Illness:    Tonya Savage is a 48 y.o. female.  Patient is newly evaluated by me I have not known her from before.  She has seen Dr. Wynonia Lawman in the past and history of essential hypertension and mitral regurgitation.  She denies any chest pain orthopnea or PND.  She has orthopedic issues involving both feet and walks about 30 minutes 3 times a week or so.  She has lost significant amount of weight by her history and tells me that she is trying hard to lose more.  She is here for a follow-up.  She denies any chest pain with that amount of exercise.  At the time of my  evaluation, the patient is alert awake oriented and in no distress.  Past Medical History:  Diagnosis Date  . Heart murmur   . Hypertension     Past Surgical History:  Procedure Laterality Date  . DILATATION & CURETTAGE/HYSTEROSCOPY WITH MYOSURE N/A 04/08/2017   Procedure: DILATATION & CURETTAGE/HYSTEROSCOPY WITH MYOSURE;  Surgeon: Thurnell Lose, MD;  Location: [facility];  Service: Gynecology;  Laterality: N/A;   Fibroids Dr. Simona Huh requests XL Myosure. Dr. Simona Huh will start with the [facility].   Marland Kitchen FOOT SURGERY    . HYSTEROSCOPY N/A 04/08/2017   Procedure: HYSTEROSCOPY WITH HYDROTHERMAL ABLATION;  Surgeon: Thurnell Lose, MD;  Location: [facility];  Service: Gynecology;  Laterality: N/A;  . TONSILLECTOMY      Current Medications: Current Meds  Medication Sig  . Cholecalciferol (VITAMIN D3) 50 MCG (2000 UT) TABS Take 1 tablet by mouth daily.  . fluticasone (FLONASE) 50 MCG/ACT nasal spray Place 1 spray into both nostrils at bedtime.  . furosemide (LASIX) 40 MG tablet Take 40-80 mg by mouth daily as needed for fluid (1-2 tablets as needed for edema).   Marland Kitchen ibuprofen (ADVIL,MOTRIN) 600 MG tablet Take 1 tablet (600 mg total) by mouth every 6 (six) hours as needed.  Marland Kitchen losartan (COZAAR) 100 MG tablet Take 100 mg by mouth daily.  . Magnesium 200 MG TABS Take 1 tablet by mouth daily.  . Multiple Vitamins-Minerals (HAIR SKIN NAILS  PO) Take 1 tablet by mouth daily.  . phentermine 37.5 MG capsule Take 37.5 mg by mouth every morning.  Marland Kitchen spironolactone (ALDACTONE) 50 MG tablet Take 50 mg by mouth daily.   Marland Kitchen topiramate (TOPAMAX) 50 MG tablet Take 50 mg by mouth daily.     Allergies:   Keflex [cephalexin] and Lisinopril   Social History   Socioeconomic History  . Marital status: Married    Spouse name: Not on file  . Number of children: Not on file  . Years of education: Not on file  . Highest education level: Not on file  Occupational History  . Not on file  Social Needs  . Financial resource  strain: Not on file  . Food insecurity:    Worry: Not on file    Inability: Not on file  . Transportation needs:    Medical: Not on file    Non-medical: Not on file  Tobacco Use  . Smoking status: Never Smoker  . Smokeless tobacco: Never Used  Substance and Sexual Activity  . Alcohol use: No  . Drug use: No  . Sexual activity: Yes    Birth control/protection: Pill  Lifestyle  . Physical activity:    Days per week: Not on file    Minutes per session: Not on file  . Stress: Not on file  Relationships  . Social connections:    Talks on phone: Not on file    Gets together: Not on file    Attends religious service: Not on file    Active member of club or organization: Not on file    Attends meetings of clubs or organizations: Not on file    Relationship status: Not on file  Other Topics Concern  . Not on file  Social History Narrative  . Not on file     Family History: The patient's family history is not on file.  ROS:   Please see the history of present illness.    All other systems reviewed and are negative.  EKGs/Labs/Other Studies Reviewed:    The following studies were reviewed today: EKG reveals sinus rhythm and nonspecific ST-T changes   Recent Labs: No results found for requested labs within last 8760 hours.  Recent Lipid Panel No results found for: CHOL, TRIG, HDL, CHOLHDL, VLDL, LDLCALC, LDLDIRECT  Physical Exam:    VS:  BP 126/72 (BP Location: Right Arm, Patient Position: Sitting, Cuff Size: Normal)   Pulse 78   Ht 6\' 1"  (1.854 m)   Wt 244 lb (110.7 kg)   SpO2 99%   BMI 32.19 kg/m     Wt Readings from Last 3 Encounters:  12/21/18 244 lb (110.7 kg)  04/01/17 254 lb 6 oz (115.4 kg)  10/26/14 256 lb 12.8 oz (116.5 kg)     GEN: Patient is in no acute distress HEENT: Normal NECK: No JVD; No carotid bruits LYMPHATICS: No lymphadenopathy CARDIAC: Hear sounds regular, 2/6 systolic murmur at the apex. RESPIRATORY:  Clear to auscultation without  rales, wheezing or rhonchi  ABDOMEN: Soft, non-tender, non-distended MUSCULOSKELETAL:  No edema; No deformity  SKIN: Warm and dry NEUROLOGIC:  Alert and oriented x 3 PSYCHIATRIC:  Normal affect   Signed, Jenean Lindau, MD  12/21/2018 8:47 AM    [place]

## 2018-12-22 ENCOUNTER — Telehealth: Payer: Self-pay

## 2018-12-22 LAB — BASIC METABOLIC PANEL
BUN / CREAT RATIO: 11 (ref 9–23)
BUN: 12 mg/dL (ref 6–24)
CHLORIDE: 106 mmol/L (ref 96–106)
CO2: 22 mmol/L (ref 20–29)
Calcium: 9.4 mg/dL (ref 8.7–10.2)
Creatinine, Ser: 1.13 mg/dL — ABNORMAL HIGH (ref 0.57–1.00)
GFR calc Af Amer: 66 mL/min/{1.73_m2} (ref >59)
GFR calc non Af Amer: 58 mL/min/{1.73_m2} — ABNORMAL LOW (ref >59)
GLUCOSE: 87 mg/dL (ref 65–99)
Potassium: 4.4 mmol/L (ref 3.5–5.2)
Sodium: 141 mmol/L (ref 134–144)

## 2018-12-22 LAB — CBC
Hematocrit: 38.1 % (ref 34.0–46.6)
Hemoglobin: 12.7 g/dL (ref 11.1–15.9)
MCH: 28.9 pg (ref 26.6–33.0)
MCHC: 33.3 g/dL (ref 31.5–35.7)
MCV: 87 fL (ref 79–97)
PLATELETS: 257 10*3/uL (ref 150–450)
RBC: 4.39 x10E6/uL (ref 3.77–5.28)
RDW: 12.5 % (ref 11.7–15.4)
WBC: 3.5 10*3/uL (ref 3.4–10.8)

## 2018-12-22 LAB — HEPATIC FUNCTION PANEL
ALBUMIN: 4.1 g/dL (ref 3.5–5.5)
ALT: 18 IU/L (ref 0–32)
AST: 19 IU/L (ref 0–40)
Alkaline Phosphatase: 92 IU/L (ref 39–117)
Bilirubin Total: 0.3 mg/dL (ref 0.0–1.2)
Bilirubin, Direct: 0.09 mg/dL (ref 0.00–0.40)
TOTAL PROTEIN: 6.7 g/dL (ref 6.0–8.5)

## 2018-12-22 LAB — LIPID PANEL
CHOL/HDL RATIO: 2.9 ratio (ref 0.0–4.4)
Cholesterol, Total: 147 mg/dL (ref 100–199)
HDL: 51 mg/dL (ref >39)
LDL CALC: 86 mg/dL (ref 0–99)
TRIGLYCERIDES: 50 mg/dL (ref 0–149)
VLDL Cholesterol Cal: 10 mg/dL (ref 5–40)

## 2018-12-22 LAB — TSH: TSH: 4.67 u[IU]/mL — ABNORMAL HIGH (ref 0.450–4.500)

## 2018-12-22 NOTE — Telephone Encounter (Signed)
Patient called and notified of lab results. 

## 2018-12-22 NOTE — Telephone Encounter (Signed)
-----   Message from Jenean Lindau, MD sent at 12/22/2018  8:56 AM EST ----- The results of the study is unremarkable. Please inform patient. I will discuss in detail at next appointment. Cc  primary care/referring physician Jenean Lindau, MD 12/22/2018 8:56 AM

## 2018-12-29 ENCOUNTER — Ambulatory Visit (HOSPITAL_BASED_OUTPATIENT_CLINIC_OR_DEPARTMENT_OTHER)
Admission: RE | Admit: 2018-12-29 | Discharge: 2018-12-29 | Disposition: A | Discharge status: Home / Self Care | Payer: 59 | Source: Ambulatory Visit | Attending: Cardiology | Admitting: Cardiology

## 2018-12-29 DIAGNOSIS — Z13220 Encounter for screening for lipoid disorders: Secondary | ICD-10-CM | POA: Diagnosis present

## 2018-12-29 DIAGNOSIS — R011 Cardiac murmur, unspecified: Secondary | ICD-10-CM

## 2018-12-29 NOTE — Progress Notes (Signed)
  Echocardiogram 2D Echocardiogram has been performed.  Cardell Peach 12/29/2018, 8:52 AM

## 2018-12-30 ENCOUNTER — Telehealth: Payer: Self-pay

## 2018-12-30 NOTE — Telephone Encounter (Signed)
-----   Message from Jenean Lindau, MD sent at 12/29/2018 12:30 PM EST ----- The results of the study is unremarkable. Please inform patient. I will discuss in detail at next appointment. Cc  primary care/referring physician Jenean Lindau, MD 12/29/2018 12:30 PM

## 2018-12-30 NOTE — Telephone Encounter (Signed)
Patient called and notified of test results. 

## 2019-03-14 ENCOUNTER — Other Ambulatory Visit: Payer: Self-pay | Admitting: Cardiology

## 2019-03-14 MED ORDER — SPIRONOLACTONE 50 MG PO TABS: 50.0000 mg | ORAL_TABLET | Freq: Every day | ORAL | 1 refills | Status: DC

## 2019-03-14 NOTE — Telephone Encounter (Signed)
Spironolactone 50 mg daily refilled.

## 2019-03-14 NOTE — Telephone Encounter (Signed)
°*  STAT* If patient is at the pharmacy, call can be transferred to refill team.   1. Which medications need to be refilled? (please list name of each medication and dose if known) Spironolactone 50mg  once daily  2. Which pharmacy/location (including street and city if local pharmacy) is medication [facility] to?[facility] [address]  3. Do they need a 30 day or 90 day supply? [place]

## 2019-06-27 ENCOUNTER — Other Ambulatory Visit: Payer: Self-pay | Admitting: Family Medicine

## 2019-06-27 DIAGNOSIS — Z1231 Encounter for screening mammogram for malignant neoplasm of breast: Secondary | ICD-10-CM

## 2019-08-08 ENCOUNTER — Ambulatory Visit
Admission: RE | Admit: 2019-08-08 | Discharge: 2019-08-08 | Disposition: A | Discharge status: Home / Self Care | Payer: BC Managed Care – PPO | Source: Ambulatory Visit | Attending: Family Medicine | Admitting: Family Medicine

## 2019-08-08 ENCOUNTER — Other Ambulatory Visit: Payer: Self-pay

## 2019-08-08 DIAGNOSIS — Z1231 Encounter for screening mammogram for malignant neoplasm of breast: Secondary | ICD-10-CM

## 2019-09-12 ENCOUNTER — Other Ambulatory Visit: Payer: Self-pay | Admitting: Obstetrics and Gynecology

## 2019-09-12 ENCOUNTER — Other Ambulatory Visit (HOSPITAL_COMMUNITY)
Admission: RE | Admit: 2019-09-12 | Discharge: 2019-09-12 | Disposition: A | Discharge status: Home / Self Care | Payer: BC Managed Care – PPO | Source: Ambulatory Visit | Attending: Obstetrics and Gynecology | Admitting: Obstetrics and Gynecology

## 2019-09-12 DIAGNOSIS — Z124 Encounter for screening for malignant neoplasm of cervix: Secondary | ICD-10-CM | POA: Diagnosis not present

## 2019-09-15 LAB — CYTOLOGY - PAP
Diagnosis: NEGATIVE
High risk HPV: NEGATIVE
Molecular Disclaimer: 56
Molecular Disclaimer: NORMAL

## 2019-09-18 ENCOUNTER — Other Ambulatory Visit: Payer: Self-pay | Admitting: Cardiology

## 2020-01-08 ENCOUNTER — Other Ambulatory Visit: Payer: Self-pay | Admitting: Cardiology

## 2020-03-31 ENCOUNTER — Other Ambulatory Visit: Payer: Self-pay | Admitting: Cardiology

## 2020-04-09 ENCOUNTER — Telehealth: Payer: Self-pay | Admitting: Cardiology

## 2020-04-09 NOTE — Telephone Encounter (Signed)
*  STAT* If patient is at the pharmacy, call can be transferred to refill team.   1. Which medications need to be refilled? (please list name of each medication and dose if known)  spironolactone (ALDACTONE) 50 MG tablet  2. Which pharmacy/location (including street and city if local pharmacy) is medication to be sent to? [id number] [place] [place] [address] [place]  3. Do they need a 30 day or 90 day supply? 16  Pt has appt to see Dr. Geraldo Pitter 05-17-19

## 2020-04-10 NOTE — Telephone Encounter (Signed)
Called patient. She reports that she cannot move appointment because she can only be seen in [place] ([place] is too far for her) and the June 2nd 2021 appointment is the soonest appointment with Dr. Geraldo Pitter in [facility]. Patient will see if her pcp will fill for her in the mean time. Advised patient to call us of they are not able to help her.

## 2020-04-10 NOTE — Telephone Encounter (Signed)
Patient has not been seen here for a very long time.  These medicines transiently should be filled by primary care physician and make appointment for her to see me in the next couple of weeks.

## 2020-04-12 ENCOUNTER — Other Ambulatory Visit (HOSPITAL_COMMUNITY): Payer: Self-pay | Admitting: Family Medicine

## 2020-04-12 DIAGNOSIS — R0989 Other specified symptoms and signs involving the circulatory and respiratory systems: Secondary | ICD-10-CM

## 2020-04-17 ENCOUNTER — Ambulatory Visit (HOSPITAL_COMMUNITY)
Admission: RE | Admit: 2020-04-17 | Discharge: 2020-04-17 | Disposition: A | Discharge status: Home / Self Care | Payer: BC Managed Care – PPO | Source: Ambulatory Visit | Attending: Family Medicine | Admitting: Family Medicine

## 2020-04-17 ENCOUNTER — Other Ambulatory Visit: Payer: Self-pay

## 2020-04-17 DIAGNOSIS — R0989 Other specified symptoms and signs involving the circulatory and respiratory systems: Secondary | ICD-10-CM | POA: Diagnosis not present

## 2020-04-17 NOTE — Progress Notes (Signed)
ABI has been completed.   Preliminary results in [facility].   Abram Sander 04/17/2020 9:16 AM

## 2020-05-15 ENCOUNTER — Other Ambulatory Visit: Payer: Self-pay

## 2020-05-16 ENCOUNTER — Encounter: Payer: Self-pay | Admitting: Cardiology

## 2020-05-16 ENCOUNTER — Ambulatory Visit: Payer: BC Managed Care – PPO | Admitting: Cardiology

## 2020-05-16 ENCOUNTER — Other Ambulatory Visit: Payer: Self-pay

## 2020-05-16 VITALS — BP 142/88 | HR 73 | Ht 73.0 in | Wt 280.0 lb

## 2020-05-16 DIAGNOSIS — I10 Essential (primary) hypertension: Secondary | ICD-10-CM

## 2020-05-16 DIAGNOSIS — E663 Overweight: Secondary | ICD-10-CM | POA: Insufficient documentation

## 2020-05-16 HISTORY — DX: Overweight: E66.3

## 2020-05-16 NOTE — Patient Instructions (Signed)

## 2020-05-16 NOTE — Progress Notes (Signed)
Cardiology Office Note:    Date:  05/16/2020   ID:  Tonya Savage, DOB [birth year], MRN [id number]  PCP:  Leighton Ruff, MD  Cardiologist:  Jenean Lindau, MD   Referring MD: Leighton Ruff, MD    ASSESSMENT:    1. Essential hypertension   2. Overweight    PLAN:    In order of problems listed above:  1. Primary prevention stressed with the patient.  Importance of compliance with diet medication stressed and she vocalized understanding.  Advised her to walk at least 5 times a week at least half an hour daily.  She promises to do so. 2. Essential hypertension: Blood pressure is stable and she will continue to monitor this.  It is better at home.  She gave me the readings. 3. Overweight: Diet was discussed and risks of obesity explained she plan to do better.  I did an extensive discussion with her about diet.  She is going to work on this on a regular basis 4. Patient will be seen in follow-up appointment in 6 months or earlier if the patient has any concerns    Medication Adjustments/Labs and Tests Ordered: Current medicines are reviewed at length with the patient today.  Concerns regarding medicines are outlined above.  Orders Placed This Encounter  Procedures  . EKG 12-Lead   No orders of the defined types were placed in this encounter.    Chief Complaint  Patient presents with  . Follow-up     History of Present Illness:    Tonya Savage is a 49 y.o. female.  Patient has past medical history of essential hypertension.  She is overweight and leads a sedentary lifestyle.  No chest pain orthopnea or PND.  She complains of bilateral pedal edema and is on diuretic for this.  She uses compression stockings with good relief.  At the time of my evaluation, the patient is alert awake oriented and in no distress.  Past Medical History:  Diagnosis Date  . Cardiac murmur 12/21/2018  . Essential hypertension 12/21/2018  . Heart murmur   . Hypertension   .  Screening for cholesterol level 12/21/2018    Past Surgical History:  Procedure Laterality Date  . DILATATION & CURETTAGE/HYSTEROSCOPY WITH MYOSURE N/A 04/08/2017   Procedure: DILATATION & CURETTAGE/HYSTEROSCOPY WITH MYOSURE;  Surgeon: Thurnell Lose, MD;  Location: [facility];  Service: Gynecology;  Laterality: N/A;   Fibroids Dr. Simona Huh requests XL Myosure. Dr. Simona Huh will start with the [facility].   Marland Kitchen FOOT SURGERY    . HYSTEROSCOPY N/A 04/08/2017   Procedure: HYSTEROSCOPY WITH HYDROTHERMAL ABLATION;  Surgeon: Thurnell Lose, MD;  Location: [facility];  Service: Gynecology;  Laterality: N/A;  . TONSILLECTOMY      Current Medications: Current Meds  Medication Sig  . Cholecalciferol (VITAMIN D3) 50 MCG (2000 UT) TABS Take 1 tablet by mouth daily.  . fluticasone (FLONASE) 50 MCG/ACT nasal spray Place 1 spray into both nostrils at bedtime.  . furosemide (LASIX) 40 MG tablet Take 40-80 mg by mouth daily as needed for fluid (1-2 tablets as needed for edema).   Marland Kitchen ibuprofen (ADVIL,MOTRIN) 600 MG tablet Take 1 tablet (600 mg total) by mouth every 6 (six) hours as needed.  Marland Kitchen losartan (COZAAR) 100 MG tablet Take 100 mg by mouth daily.  . Multiple Vitamins-Minerals (HAIR SKIN NAILS PO) Take 1 tablet by mouth daily.  . naproxen (NAPROSYN) 500 MG tablet Take 500 mg by mouth every 12 (twelve) hours as needed.  Marland Kitchen spironolactone (  ALDACTONE) 50 MG tablet Take 1 tablet (50 mg total) by mouth daily. PLEASE CALL OFFICE TO SCHEDULE APPOINTMENT FOR FURTHER REFILLS.  Marland Kitchen topiramate (TOPAMAX) 50 MG tablet Take 50 mg by mouth daily.     Allergies:   Keflex [cephalexin] and Lisinopril   Social History   Socioeconomic History  . Marital status: Married    Spouse name: Not on file  . Number of children: Not on file  . Years of education: Not on file  . Highest education level: Not on file  Occupational History  . Not on file  Tobacco Use  . Smoking status: Never Smoker  . Smokeless tobacco: Never Used  Substance  and Sexual Activity  . Alcohol use: No  . Drug use: No  . Sexual activity: Yes    Birth control/protection: Pill  Other Topics Concern  . Not on file  Social History Narrative  . Not on file   Social Determinants of Health   Financial Resource Strain:   . Difficulty of Paying Living Expenses:   Food Insecurity:   . Worried About Charity fundraiser in the Last Year:   . Arboriculturist in the Last Year:   Transportation Needs:   . Film/video editor (Medical):   Marland Kitchen Lack of Transportation (Non-Medical):   Physical Activity:   . Days of Exercise per Week:   . Minutes of Exercise per Session:   Stress:   . Feeling of Stress :   Social Connections:   . Frequency of Communication with Friends and Family:   . Frequency of Social Gatherings with Friends and Family:   . Attends Religious Services:   . Active Member of Clubs or Organizations:   . Attends Archivist Meetings:   Marland Kitchen Marital Status:      Family History: The patient's family history is not on file.  ROS:   Please see the history of present illness.    All other systems reviewed and are negative.  EKGs/Labs/Other Studies Reviewed:    The following studies were reviewed today: I discussed my findings with the patient at length   Recent Labs: No results found for requested labs within last 8760 hours.  Recent Lipid Panel    Component Value Date/Time   CHOL 147 12/21/2018 0856   TRIG 50 12/21/2018 0856   HDL 51 12/21/2018 0856   CHOLHDL 2.9 12/21/2018 0856   LDLCALC 86 12/21/2018 0856    Physical Exam:    VS:  BP (!) 142/88   Pulse 73   Ht 6\' 1"  (1.854 m)   Wt 280 lb (127 kg)   SpO2 98%   BMI 36.94 kg/m     Wt Readings from Last 3 Encounters:  05/16/20 280 lb (127 kg)  12/21/18 244 lb (110.7 kg)  04/01/17 254 lb 6 oz (115.4 kg)     GEN: Patient is in no acute distress HEENT: Normal NECK: No JVD; No carotid bruits LYMPHATICS: No lymphadenopathy CARDIAC: Hear sounds regular, 2/6  systolic murmur at the apex. RESPIRATORY:  Clear to auscultation without rales, wheezing or rhonchi  ABDOMEN: Soft, non-tender, non-distended MUSCULOSKELETAL:  No edema; No deformity  SKIN: Warm and dry NEUROLOGIC:  Alert and oriented x 3 PSYCHIATRIC:  Normal affect   Signed, Jenean Lindau, MD  05/16/2020 11:57 AM    [place]

## 2020-07-09 ENCOUNTER — Other Ambulatory Visit: Payer: Self-pay | Admitting: Family Medicine

## 2020-07-09 DIAGNOSIS — Z1231 Encounter for screening mammogram for malignant neoplasm of breast: Secondary | ICD-10-CM

## 2020-08-08 ENCOUNTER — Ambulatory Visit
Admission: RE | Admit: 2020-08-08 | Discharge: 2020-08-08 | Disposition: A | Discharge status: Home / Self Care | Payer: BC Managed Care – PPO | Source: Ambulatory Visit | Attending: Family Medicine | Admitting: Family Medicine

## 2020-08-08 ENCOUNTER — Other Ambulatory Visit: Payer: Self-pay

## 2020-08-08 DIAGNOSIS — Z1231 Encounter for screening mammogram for malignant neoplasm of breast: Secondary | ICD-10-CM

## 2020-11-19 ENCOUNTER — Ambulatory Visit: Payer: BC Managed Care – PPO | Admitting: Cardiology

## 2020-11-19 DIAGNOSIS — I10 Essential (primary) hypertension: Secondary | ICD-10-CM | POA: Insufficient documentation

## 2020-11-19 DIAGNOSIS — R011 Cardiac murmur, unspecified: Secondary | ICD-10-CM | POA: Insufficient documentation

## 2020-11-20 ENCOUNTER — Ambulatory Visit: Payer: BC Managed Care – PPO | Admitting: Cardiology

## 2020-11-20 ENCOUNTER — Encounter: Payer: Self-pay | Admitting: Cardiology

## 2020-11-20 ENCOUNTER — Other Ambulatory Visit: Payer: Self-pay

## 2020-11-20 VITALS — BP 134/84 | HR 80 | Ht 73.0 in | Wt 279.0 lb

## 2020-11-20 DIAGNOSIS — E669 Obesity, unspecified: Secondary | ICD-10-CM

## 2020-11-20 DIAGNOSIS — Z13220 Encounter for screening for lipoid disorders: Secondary | ICD-10-CM | POA: Diagnosis not present

## 2020-11-20 DIAGNOSIS — I10 Essential (primary) hypertension: Secondary | ICD-10-CM

## 2020-11-20 HISTORY — DX: Obesity, unspecified: E66.9

## 2020-11-20 NOTE — Patient Instructions (Signed)

## 2020-11-20 NOTE — Progress Notes (Signed)
Cardiology Office Note:    Date:  11/20/2020   ID:  Tonya Savage, DOB 1971-08-13, MRN [id number]  PCP:  Leighton Ruff, MD  Cardiologist:  Jenean Lindau, MD   Referring MD: Leighton Ruff, MD    ASSESSMENT:    1. Essential hypertension   2. Screening for cholesterol level   3. Obesity (BMI 35.0-39.9 without comorbidity)    PLAN:    In order of problems listed above:  1. Primary prevention stressed with the patient.  Importance of compliance with diet medication stressed and she vocalized understanding.  I told her to walk at least half an hour a day 5 days a week and she promises to do so. 2. Obesity: Weight reduction was stressed.  Diet was emphasized. 3. Patient has multiple medications which can affect electrolytes and so we will do blood work today including fasting lipids and Chem-7. 4. Patient will be seen in follow-up appointment in 6 months or earlier if the patient has any concerns    Medication Adjustments/Labs and Tests Ordered: Current medicines are reviewed at length with the patient today.  Concerns regarding medicines are outlined above.  Orders Placed This Encounter  Procedures  . Basic metabolic panel  . Hepatic function panel  . Lipid panel  . TSH  . CBC with Differential/Platelet   No orders of the defined types were placed in this encounter.    No chief complaint on file.    History of Present Illness:    Tonya Savage is a 49 y.o. female.  Patient has past medical history of essential hypertension and obesity.  She denies any problems at this time and takes care of activities of daily living.  She leads a sedentary lifestyle and does not exercise on a regular basis.  At the time of my evaluation, the patient is alert awake oriented and in no distress.  Past Medical History:  Diagnosis Date  . Cardiac murmur 12/21/2018  . Essential hypertension 12/21/2018  . Heart murmur   . Hypertension   . Overweight 05/16/2020  .  Screening for cholesterol level 12/21/2018    Past Surgical History:  Procedure Laterality Date  . DILATATION & CURETTAGE/HYSTEROSCOPY WITH MYOSURE N/A 04/08/2017   Procedure: DILATATION & CURETTAGE/HYSTEROSCOPY WITH MYOSURE;  Surgeon: Thurnell Lose, MD;  Location: [facility];  Service: Gynecology;  Laterality: N/A;   Fibroids Dr. Simona Huh requests XL Myosure. Dr. Simona Huh will start with the [facility].   Marland Kitchen FOOT SURGERY    . HYSTEROSCOPY N/A 04/08/2017   Procedure: HYSTEROSCOPY WITH HYDROTHERMAL ABLATION;  Surgeon: Thurnell Lose, MD;  Location: [place] [facility];  Service: Gynecology;  Laterality: N/A;  . TONSILLECTOMY      Current Medications: Current Meds  Medication Sig  . Cholecalciferol (VITAMIN D3) 50 MCG (2000 UT) TABS Take 1 tablet by mouth daily.  . fluticasone (FLONASE) 50 MCG/ACT nasal spray Place 1 spray into both nostrils as needed for allergies or rhinitis.  . furosemide (LASIX) 40 MG tablet Take 40-80 mg by mouth daily as needed for fluid (1-2 tablets as needed for edema).   Marland Kitchen ibuprofen (ADVIL,MOTRIN) 600 MG tablet Take 1 tablet (600 mg total) by mouth every 6 (six) hours as needed.  Marland Kitchen losartan (COZAAR) 100 MG tablet Take 100 mg by mouth daily.  . Multiple Vitamins-Minerals (HAIR SKIN NAILS PO) Take 1 tablet by mouth daily.  . naproxen (NAPROSYN) 500 MG tablet Take 500 mg by mouth every 12 (twelve) hours as needed.  Marland Kitchen spironolactone (ALDACTONE) 50 MG tablet  Take 1 tablet (50 mg total) by mouth daily. PLEASE CALL OFFICE TO SCHEDULE APPOINTMENT FOR FURTHER REFILLS.  Marland Kitchen TURMERIC PO Take 1,000 mg by mouth daily.     Allergies:   Keflex [cephalexin] and Lisinopril   Social History   Socioeconomic History  . Marital status: Married    Spouse name: Not on file  . Number of children: Not on file  . Years of education: Not on file  . Highest education level: Not on file  Occupational History  . Not on file  Tobacco Use  . Smoking status: Never Smoker  . Smokeless tobacco: Never Used   Substance and Sexual Activity  . Alcohol use: No  . Drug use: No  . Sexual activity: Yes    Birth control/protection: Pill  Other Topics Concern  . Not on file  Social History Narrative  . Not on file   Social Determinants of Health   Financial Resource Strain:   . Difficulty of Paying Living Expenses: Not on file  Food Insecurity:   . Worried About Charity fundraiser in the Last Year: Not on file  . Ran Out of Food in the Last Year: Not on file  Transportation Needs:   . Lack of Transportation (Medical): Not on file  . Lack of Transportation (Non-Medical): Not on file  Physical Activity:   . Days of Exercise per Week: Not on file  . Minutes of Exercise per Session: Not on file  Stress:   . Feeling of Stress : Not on file  Social Connections:   . Frequency of Communication with Friends and Family: Not on file  . Frequency of Social Gatherings with Friends and Family: Not on file  . Attends Religious Services: Not on file  . Active Member of Clubs or Organizations: Not on file  . Attends Archivist Meetings: Not on file  . Marital Status: Not on file     Family History: The patient's family history includes Cancer in her maternal grandmother and mother; Hypertension in her mother.  ROS:   Please see the history of present illness.    All other systems reviewed and are negative.  EKGs/Labs/Other Studies Reviewed:    The following studies were reviewed today: I discussed my findings with the patient at length.   Recent Labs: No results found for requested labs within last 8760 hours.  Recent Lipid Panel    Component Value Date/Time   CHOL 147 12/21/2018 0856   TRIG 50 12/21/2018 0856   HDL 51 12/21/2018 0856   CHOLHDL 2.9 12/21/2018 0856   LDLCALC 86 12/21/2018 0856    Physical Exam:    VS:  BP 134/84   Pulse 80   Ht 6\' 1"  (1.854 m)   Wt 279 lb (126.6 kg)   SpO2 100%   BMI 36.81 kg/m     Wt Readings from Last 3 Encounters:  11/20/20 279  lb (126.6 kg)  05/16/20 280 lb (127 kg)  12/21/18 244 lb (110.7 kg)     GEN: Patient is in no acute distress HEENT: Normal NECK: No JVD; No carotid bruits LYMPHATICS: No lymphadenopathy CARDIAC: Hear sounds regular, 2/6 systolic murmur at the apex. RESPIRATORY:  Clear to auscultation without rales, wheezing or rhonchi  ABDOMEN: Soft, non-tender, non-distended MUSCULOSKELETAL:  No edema; No deformity  SKIN: Warm and dry NEUROLOGIC:  Alert and oriented x 3 PSYCHIATRIC:  Normal affect   Signed, Jenean Lindau, MD  11/20/2020 9:34 AM    [facility]

## 2020-11-21 LAB — CBC WITH DIFFERENTIAL/PLATELET
Basophils Absolute: 0 10*3/uL (ref 0.0–0.2)
Basos: 0 %
EOS (ABSOLUTE): 0 10*3/uL (ref 0.0–0.4)
Eos: 1 %
Hematocrit: 42.1 % (ref 34.0–46.6)
Hemoglobin: 13.7 g/dL (ref 11.1–15.9)
Immature Grans (Abs): 0 10*3/uL (ref 0.0–0.1)
Immature Granulocytes: 0 %
Lymphocytes Absolute: 1.6 10*3/uL (ref 0.7–3.1)
Lymphs: 45 %
MCH: 27.6 pg (ref 26.6–33.0)
MCHC: 32.5 g/dL (ref 31.5–35.7)
MCV: 85 fL (ref 79–97)
Monocytes Absolute: 0.3 10*3/uL (ref 0.1–0.9)
Monocytes: 8 %
Neutrophils Absolute: 1.6 10*3/uL (ref 1.4–7.0)
Neutrophils: 46 %
Platelets: 268 10*3/uL (ref 150–450)
RBC: 4.97 x10E6/uL (ref 3.77–5.28)
RDW: 13 % (ref 11.7–15.4)
WBC: 3.5 10*3/uL (ref 3.4–10.8)

## 2020-11-21 LAB — HEPATIC FUNCTION PANEL
ALT: 53 IU/L — ABNORMAL HIGH (ref 0–32)
AST: 32 IU/L (ref 0–40)
Albumin: 4.1 g/dL (ref 3.8–4.8)
Alkaline Phosphatase: 93 IU/L (ref 44–121)
Bilirubin Total: 0.2 mg/dL (ref 0.0–1.2)
Bilirubin, Direct: <0.1 mg/dL (ref 0.00–0.40)
Total Protein: 7.1 g/dL (ref 6.0–8.5)

## 2020-11-21 LAB — BASIC METABOLIC PANEL
BUN/Creatinine Ratio: 14 (ref 9–23)
BUN: 14 mg/dL (ref 6–24)
CO2: 23 mmol/L (ref 20–29)
Calcium: 9.8 mg/dL (ref 8.7–10.2)
Chloride: 100 mmol/L (ref 96–106)
Creatinine, Ser: 1.01 mg/dL — ABNORMAL HIGH (ref 0.57–1.00)
GFR calc Af Amer: 75 mL/min/{1.73_m2} (ref >59)
GFR calc non Af Amer: 65 mL/min/{1.73_m2} (ref >59)
Glucose: 94 mg/dL (ref 65–99)
Potassium: 4.3 mmol/L (ref 3.5–5.2)
Sodium: 137 mmol/L (ref 134–144)

## 2020-11-21 LAB — LIPID PANEL
Chol/HDL Ratio: 3.1 ratio (ref 0.0–4.4)
Cholesterol, Total: 172 mg/dL (ref 100–199)
HDL: 55 mg/dL (ref >39)
LDL Chol Calc (NIH): 105 mg/dL — ABNORMAL HIGH (ref 0–99)
Triglycerides: 61 mg/dL (ref 0–149)
VLDL Cholesterol Cal: 12 mg/dL (ref 5–40)

## 2020-11-21 LAB — TSH: TSH: 2.51 u[IU]/mL (ref 0.450–4.500)

## 2021-06-03 DIAGNOSIS — R609 Edema, unspecified: Secondary | ICD-10-CM

## 2021-06-03 DIAGNOSIS — I340 Nonrheumatic mitral (valve) insufficiency: Secondary | ICD-10-CM

## 2021-06-03 DIAGNOSIS — I119 Hypertensive heart disease without heart failure: Secondary | ICD-10-CM | POA: Insufficient documentation

## 2021-06-03 DIAGNOSIS — N920 Excessive and frequent menstruation with regular cycle: Secondary | ICD-10-CM | POA: Insufficient documentation

## 2021-06-03 DIAGNOSIS — Z8041 Family history of malignant neoplasm of ovary: Secondary | ICD-10-CM

## 2021-06-03 HISTORY — DX: Edema, unspecified: R60.9

## 2021-06-03 HISTORY — DX: Family history of malignant neoplasm of ovary: Z80.41

## 2021-06-03 HISTORY — DX: Nonrheumatic mitral (valve) insufficiency: I34.0

## 2021-06-03 HISTORY — DX: Excessive and frequent menstruation with regular cycle: N92.0

## 2021-06-03 HISTORY — DX: Morbid (severe) obesity due to excess calories: E66.01

## 2021-06-03 HISTORY — DX: Hypertensive heart disease without heart failure: I11.9

## 2021-06-05 ENCOUNTER — Ambulatory Visit: Payer: BC Managed Care – PPO | Admitting: Cardiology

## 2021-06-05 ENCOUNTER — Other Ambulatory Visit: Payer: Self-pay

## 2021-06-05 ENCOUNTER — Encounter: Payer: Self-pay | Admitting: Cardiology

## 2021-06-05 VITALS — BP 124/86 | HR 87 | Ht 73.0 in | Wt 253.1 lb

## 2021-06-05 DIAGNOSIS — I340 Nonrheumatic mitral (valve) insufficiency: Secondary | ICD-10-CM | POA: Diagnosis not present

## 2021-06-05 DIAGNOSIS — Z13220 Encounter for screening for lipoid disorders: Secondary | ICD-10-CM | POA: Diagnosis not present

## 2021-06-05 DIAGNOSIS — I10 Essential (primary) hypertension: Secondary | ICD-10-CM | POA: Diagnosis not present

## 2021-06-05 DIAGNOSIS — E669 Obesity, unspecified: Secondary | ICD-10-CM

## 2021-06-05 NOTE — Progress Notes (Signed)
Cardiology Office Note:    Date:  06/05/2021   ID:  Tonya Savage, DOB [birth year], MRN [id number]  PCP:  Tonya Jewel, MD  Cardiologist:  Jenean Lindau, MD   Referring MD: Tonya Ruff, MD    ASSESSMENT:    1. Essential hypertension   2. Mitral valve insufficiency, unspecified etiology   3. Screening for cholesterol level   4. Obesity (BMI 35.0-39.9 without comorbidity)    PLAN:    In order of problems listed above:  Primary prevention stressed with the patient.  Importance of compliance with diet medication stressed and she vocalized understanding.  Her effort tolerance is excellent and she is happy about. Essential hypertension: Blood pressure stable and diet was emphasized.  Lifestyle modification urged. Obesity: She has done a remarkable job with exercise and diet losing weight and she is very happy about it. In view of the fact that she is on multiple medications we will do blood work including fasting lipids. Patient will be seen in follow-up appointment in 6 months or earlier if the patient has any concerns    Medication Adjustments/Labs and Tests Ordered: Current medicines are reviewed at length with the patient today.  Concerns regarding medicines are outlined above.  Orders Placed This Encounter  Procedures   Basic metabolic panel   Hepatic function panel   Lipid panel   EKG 12-Lead   No orders of the defined types were placed in this encounter.    No chief complaint on file.    History of Present Illness:    Tonya Savage is a 50 y.o. female.  Patient has past medical history of essential hypertension dyslipidemia and obesity.  She denies any problems at this time and takes care of activities of daily living.  She has done a remarkable job losing weight with exercise and diet and is very happy about.  At the time of my evaluation, the patient is alert awake oriented and in no distress.  Past Medical History:  Diagnosis Date    Cardiac murmur 12/21/2018   Edema 06/03/2021   Essential hypertension 12/21/2018   Family history of malignant neoplasm of ovary 06/03/2021   Heart murmur    Hypertension    Hypertensive heart disease without congestive heart failure 06/03/2021   Menorrhagia 06/03/2021   Mitral regurgitation 06/03/2021   Morbid obesity ([place]) 06/03/2021   Obesity (BMI 35.0-39.9 without comorbidity) 11/20/2020   Overweight 05/16/2020   Screening for cholesterol level 12/21/2018    Past Surgical History:  Procedure Laterality Date   DILATATION & CURETTAGE/HYSTEROSCOPY WITH MYOSURE N/A 04/08/2017   Procedure: DILATATION & CURETTAGE/HYSTEROSCOPY WITH MYOSURE;  Surgeon: Thurnell Lose, MD;  Location: [place] [facility];  Service: Gynecology;  Laterality: N/A;   Fibroids Dr. Simona Huh requests XL Myosure. Dr. Simona Huh [facility] with the [facility].    FOOT SURGERY     HYSTEROSCOPY N/A 04/08/2017   Procedure: HYSTEROSCOPY WITH HYDROTHERMAL ABLATION;  Surgeon: Thurnell Lose, MD;  Location: [place] [facility];  Service: Gynecology;  Laterality: N/A;   TONSILLECTOMY      Current Medications: Current Meds  Medication Sig   Cholecalciferol (VITAMIN D3) 50 MCG (2000 UT) TABS Take 1 tablet by mouth daily.   fluticasone (FLONASE) 50 MCG/ACT nasal spray Place 1 spray into both nostrils as needed for allergies or rhinitis.   furosemide (LASIX) 40 MG tablet Take 40-80 mg by mouth daily as needed for fluid (1-2 tablets as needed for edema).   losartan (COZAAR) 100 MG tablet Take 100 mg by  mouth daily.   Multiple Vitamins-Minerals (HAIR SKIN NAILS PO) Take 1 tablet by mouth daily.   naproxen (NAPROSYN) 500 MG tablet Take 500 mg by mouth every 12 (twelve) hours as needed for pain or headache.   spironolactone (ALDACTONE) 50 MG tablet Take 1 tablet (50 mg total) by mouth daily. PLEASE CALL OFFICE TO SCHEDULE APPOINTMENT FOR FURTHER REFILLS.   TURMERIC PO Take 1,000 mg by mouth daily.     Allergies:   Keflex [cephalexin] and Lisinopril   Social History    Socioeconomic History   Marital status: Single    Spouse name: Not on file   Number of children: Not on file   Years of education: Not on file   Highest education level: Not on file  Occupational History   Not on file  Tobacco Use   Smoking status: Never   Smokeless tobacco: Never  Substance and Sexual Activity   Alcohol use: No   Drug use: No   Sexual activity: Yes    Birth control/protection: Pill  Other Topics Concern   Not on file  Social History Narrative   Not on file   Social Determinants of Health   Financial Resource Strain: Not on file  Food Insecurity: Not on file  Transportation Needs: Not on file  Physical Activity: Not on file  Stress: Not on file  Social Connections: Not on file     Family History: The patient's family history includes Cancer in her maternal grandmother and mother; Hypertension in her mother.  ROS:   Please see the history of present illness.    All other systems reviewed and are negative.  EKGs/Labs/Other Studies Reviewed:    The following studies were reviewed today: I discussed my findings with the patient at extensive length.   Recent Labs: 11/20/2020: ALT 53; BUN 14; Creatinine, Ser 1.01; Hemoglobin 13.7; Platelets 268; Potassium 4.3; Sodium 137; TSH 2.510  Recent Lipid Panel    Component Value Date/Time   CHOL 172 11/20/2020 0942   TRIG 61 11/20/2020 0942   HDL 55 11/20/2020 0942   CHOLHDL 3.1 11/20/2020 0942   LDLCALC 105 (H) 11/20/2020 0942    Physical Exam:    VS:  BP 124/86   Pulse 87   Ht 6\' 1"  (1.854 m)   Wt 253 lb 1.3 oz (114.8 kg)   SpO2 99%   BMI 33.39 kg/m     Wt Readings from Last 3 Encounters:  06/05/21 253 lb 1.3 oz (114.8 kg)  11/20/20 279 lb (126.6 kg)  05/16/20 280 lb (127 kg)     GEN: Patient is in no acute distress HEENT: Normal NECK: No JVD; No carotid bruits LYMPHATICS: No lymphadenopathy CARDIAC: Hear sounds regular, 2/6 systolic murmur at the apex. RESPIRATORY:  Clear to  auscultation without rales, wheezing or rhonchi  ABDOMEN: Soft, non-tender, non-distended MUSCULOSKELETAL:  No edema; No deformity  SKIN: Warm and dry NEUROLOGIC:  Alert and oriented x 3 PSYCHIATRIC:  Normal affect   Signed, Jenean Lindau, MD  06/05/2021 8:55 AM    [place]

## 2021-06-05 NOTE — Patient Instructions (Signed)
Medication Instructions:  No medication changes. *If you need a refill on your cardiac medications before your next appointment, please call your pharmacy*   Lab Work: Your physician recommends that you have labs done in the office today. Your test included  basic metabolic panel, liver function and lipids.  If you have labs (blood work) drawn today and your tests are completely normal, you will receive your results only by: [place] (if you have MyChart) OR A paper copy in the mail If you have any lab test that is abnormal or we need to change your treatment, we will call you to review the results.   Testing/Procedures: None ordered   Follow-Up: At [facility], you and your health needs are our priority.  As part of our continuing mission to provide you with exceptional heart care, we have created designated Provider Care Teams.  These Care Teams include your primary Cardiologist (physician) and Advanced Practice Providers (APPs -  Physician Assistants and Nurse Practitioners) who all work together to provide you with the care you need, when you need it.  We recommend signing up for the patient portal called "MyChart".  Sign up information is provided on this After Visit Summary.  MyChart is used to connect with patients for Virtual Visits (Telemedicine).  Patients are able to view lab/test results, encounter notes, upcoming appointments, etc.  Non-urgent messages can be sent to your provider as well.   To learn more about what you can do with MyChart, go to [email or web address].    Your next appointment:   6 month(s)  The format for your next appointment:   In Person  Provider:   Jyl Heinz, MD   Other Instructions NA

## 2021-06-06 LAB — BASIC METABOLIC PANEL
BUN/Creatinine Ratio: 14 (ref 9–23)
BUN: 15 mg/dL (ref 6–24)
CO2: 24 mmol/L (ref 20–29)
Calcium: 9.9 mg/dL (ref 8.7–10.2)
Chloride: 100 mmol/L (ref 96–106)
Creatinine, Ser: 1.08 mg/dL — ABNORMAL HIGH (ref 0.57–1.00)
Glucose: 91 mg/dL (ref 65–99)
Potassium: 4.1 mmol/L (ref 3.5–5.2)
Sodium: 141 mmol/L (ref 134–144)
eGFR: 62 mL/min/{1.73_m2} (ref >59)

## 2021-06-06 LAB — HEPATIC FUNCTION PANEL
ALT: 23 IU/L (ref 0–32)
AST: 15 IU/L (ref 0–40)
Albumin: 4.1 g/dL (ref 3.8–4.9)
Alkaline Phosphatase: 118 IU/L (ref 44–121)
Bilirubin Total: <0.2 mg/dL (ref 0.0–1.2)
Bilirubin, Direct: <0.1 mg/dL (ref 0.00–0.40)
Total Protein: 6.9 g/dL (ref 6.0–8.5)

## 2021-06-06 LAB — LIPID PANEL
Chol/HDL Ratio: 3.1 ratio (ref 0.0–4.4)
Cholesterol, Total: 181 mg/dL (ref 100–199)
HDL: 58 mg/dL (ref >39)
LDL Chol Calc (NIH): 111 mg/dL — ABNORMAL HIGH (ref 0–99)
Triglycerides: 64 mg/dL (ref 0–149)
VLDL Cholesterol Cal: 12 mg/dL (ref 5–40)

## 2021-07-15 ENCOUNTER — Other Ambulatory Visit: Payer: Self-pay | Admitting: Internal Medicine

## 2021-07-15 DIAGNOSIS — Z1231 Encounter for screening mammogram for malignant neoplasm of breast: Secondary | ICD-10-CM

## 2021-09-04 ENCOUNTER — Other Ambulatory Visit: Payer: Self-pay

## 2021-09-04 ENCOUNTER — Ambulatory Visit
Admission: RE | Admit: 2021-09-04 | Discharge: 2021-09-04 | Disposition: A | Discharge status: Home / Self Care | Payer: BC Managed Care – PPO | Source: Ambulatory Visit | Attending: Internal Medicine | Admitting: Internal Medicine

## 2021-09-04 DIAGNOSIS — Z1231 Encounter for screening mammogram for malignant neoplasm of breast: Secondary | ICD-10-CM

## 2021-11-12 ENCOUNTER — Other Ambulatory Visit: Payer: Self-pay

## 2021-11-12 DIAGNOSIS — G8929 Other chronic pain: Secondary | ICD-10-CM

## 2021-11-12 HISTORY — DX: Other chronic pain: G89.29

## 2021-11-13 ENCOUNTER — Encounter: Payer: Self-pay | Admitting: Cardiology

## 2021-11-13 ENCOUNTER — Ambulatory Visit: Payer: BC Managed Care – PPO | Admitting: Cardiology

## 2021-11-13 ENCOUNTER — Other Ambulatory Visit: Payer: Self-pay

## 2021-11-13 VITALS — BP 132/74 | HR 80 | Ht 73.0 in | Wt 265.0 lb

## 2021-11-13 DIAGNOSIS — I10 Essential (primary) hypertension: Secondary | ICD-10-CM

## 2021-11-13 DIAGNOSIS — E669 Obesity, unspecified: Secondary | ICD-10-CM

## 2021-11-13 DIAGNOSIS — I340 Nonrheumatic mitral (valve) insufficiency: Secondary | ICD-10-CM

## 2021-11-13 NOTE — Patient Instructions (Signed)
Medication Instructions:  Your physician recommends that you continue on your current medications as directed. Please refer to the Current Medication list given to you today.  *If you need a refill on your cardiac medications before your next appointment, please call your pharmacy*   Lab Work: Your physician recommends that you have a BMET done today in the office.  If you have labs (blood work) drawn today and your tests are completely normal, you will receive your results only by: [place] (if you have MyChart) OR A paper copy in the mail If you have any lab test that is abnormal or we need to change your treatment, we will call you to review the results.   Testing/Procedures: None ordered   Follow-Up: At [facility], you and your health needs are our priority.  As part of our continuing mission to provide you with exceptional heart care, we have created designated Provider Care Teams.  These Care Teams include your primary Cardiologist (physician) and Advanced Practice Providers (APPs -  Physician Assistants and Nurse Practitioners) who all work together to provide you with the care you need, when you need it.  We recommend signing up for the patient portal called "MyChart".  Sign up information is provided on this After Visit Summary.  MyChart is used to connect with patients for Virtual Visits (Telemedicine).  Patients are able to view lab/test results, encounter notes, upcoming appointments, etc.  Non-urgent messages can be sent to your provider as well.   To learn more about what you can do with MyChart, go to [email or web address].    Your next appointment:   9 month(s)  The format for your next appointment:   In Person  Provider:   Jyl Heinz, MD   Other Instructions NA

## 2021-11-13 NOTE — Progress Notes (Signed)
Cardiology Office Note:    Date:  11/13/2021   ID:  Tonya Savage, DOB 1971/09/29, MRN [id number]  PCP:  Mckinley Jewel, MD  Cardiologist:  Jenean Lindau, MD   Referring MD: Mckinley Jewel, MD    ASSESSMENT:    1. Essential hypertension   2. Mitral valve insufficiency, unspecified etiology   3. Obesity (BMI 35.0-39.9 without comorbidity)    PLAN:    In order of problems listed above:  Primary prevention stressed with the patient.  Importance of compliance with diet medication stressed and she vocalized understanding.  She was advised to walk at least half an hour a day 5 days a week and she promises to do so. Essential hypertension: Blood pressure stable and diet was emphasized.  Lifestyle modification. Mild dyslipidemia obesity: Weight reduction stressed risks of obesity explained and she plans to do that with diet and exercise. Mild mitral regurgitation: Seen on echocardiogram in the past.  She had questions about it.  Stable and I reassured her about the same. Patient will be seen in follow-up appointment in 6 months or earlier if the patient has any concerns    Medication Adjustments/Labs and Tests Ordered: Current medicines are reviewed at length with the patient today.  Concerns regarding medicines are outlined above.  No orders of the defined types were placed in this encounter.  No orders of the defined types were placed in this encounter.    No chief complaint on file.    History of Present Illness:    Tonya Savage is a 50 y.o. female.  Patient has history of essential hypertension, mild dyslipidemia and obesity.  She has mild mitral regurgitation.  She denies any problems at this time and takes care of activities of daily living.  No chest pain orthopnea or PND.  At the time of my evaluation, the patient is alert awake oriented and in no distress.  She tells me that she leads a sedentary lifestyle.  She wants to do better.  Past Medical  History:  Diagnosis Date   Cardiac murmur 12/21/2018   Chronic pain 11/12/2021   Edema 06/03/2021   Essential hypertension 12/21/2018   Family history of malignant neoplasm of ovary 06/03/2021   Heart murmur    Hypertension    Hypertensive heart disease without congestive heart failure 06/03/2021   Menorrhagia 06/03/2021   Mitral regurgitation 06/03/2021   Morbid obesity ([place]) 06/03/2021   Obesity (BMI 35.0-39.9 without comorbidity) 11/20/2020   Overweight 05/16/2020   Screening for cholesterol level 12/21/2018    Past Surgical History:  Procedure Laterality Date   DILATATION & CURETTAGE/HYSTEROSCOPY WITH MYOSURE N/A 04/08/2017   Procedure: DILATATION & CURETTAGE/HYSTEROSCOPY WITH MYOSURE;  Surgeon: Thurnell Lose, MD;  Location: [facility];  Service: Gynecology;  Laterality: N/A;   Fibroids Dr. Simona Huh requests XL Myosure. Dr. Simona Huh will start with the [facility].    FOOT SURGERY     HYSTEROSCOPY N/A 04/08/2017   Procedure: HYSTEROSCOPY WITH HYDROTHERMAL ABLATION;  Surgeon: Thurnell Lose, MD;  Location: [facility];  Service: Gynecology;  Laterality: N/A;   TONSILLECTOMY      Current Medications: Current Meds  Medication Sig   Cholecalciferol (VITAMIN D3) 50 MCG (2000 UT) TABS Take 1 tablet by mouth daily.   Ciclopirox 0.77 % gel Apply 1 application topically at bedtime.   furosemide (LASIX) 40 MG tablet Take 40-80 mg by mouth daily as needed for fluid (1-2 tablets as needed for edema).   losartan (COZAAR) 100 MG tablet Take  100 mg by mouth daily.   Multiple Vitamins-Minerals (HAIR SKIN NAILS PO) Take 1 tablet by mouth daily.   naproxen (NAPROSYN) 500 MG tablet Take 500 mg by mouth every 12 (twelve) hours as needed for pain or headache.   spironolactone (ALDACTONE) 50 MG tablet Take 1 tablet (50 mg total) by mouth daily. PLEASE CALL OFFICE TO SCHEDULE APPOINTMENT FOR FURTHER REFILLS.   TURMERIC PO Take 1,000 mg by mouth daily.     Allergies:   Keflex [cephalexin] and Lisinopril   Social History    Socioeconomic History   Marital status: Single    Spouse name: Not on file   Number of children: Not on file   Years of education: Not on file   Highest education level: Not on file  Occupational History   Not on file  Tobacco Use   Smoking status: Never   Smokeless tobacco: Never  Substance and Sexual Activity   Alcohol use: No   Drug use: No   Sexual activity: Yes    Birth control/protection: Pill  Other Topics Concern   Not on file  Social History Narrative   Not on file   Social Determinants of Health   Financial Resource Strain: Not on file  Food Insecurity: Not on file  Transportation Needs: Not on file  Physical Activity: Not on file  Stress: Not on file  Social Connections: Not on file     Family History: The patient's family history includes Cancer in her maternal grandmother and mother; Hypertension in her mother.  ROS:   Please see the history of present illness.    All other systems reviewed and are negative.  EKGs/Labs/Other Studies Reviewed:    The following studies were reviewed today: EKG reveals sinus rhythm with nonspecific ST-T changes   Recent Labs: 11/20/2020: Hemoglobin 13.7; Platelets 268; TSH 2.510 06/05/2021: ALT 23; BUN 15; Creatinine, Ser 1.08; Potassium 4.1; Sodium 141  Recent Lipid Panel    Component Value Date/Time   CHOL 181 06/05/2021 0849   TRIG 64 06/05/2021 0849   HDL 58 06/05/2021 0849   CHOLHDL 3.1 06/05/2021 0849   LDLCALC 111 (H) 06/05/2021 0849    Physical Exam:    VS:  BP 132/74   Pulse 80   Ht 6\' 1"  (1.854 m)   Wt 265 lb (120.2 kg)   SpO2 97%   BMI 34.96 kg/m     Wt Readings from Last 3 Encounters:  11/13/21 265 lb (120.2 kg)  06/05/21 253 lb 1.3 oz (114.8 kg)  11/20/20 279 lb (126.6 kg)     GEN: Patient is in no acute distress HEENT: Normal NECK: No JVD; No carotid bruits LYMPHATICS: No lymphadenopathy CARDIAC: Hear sounds regular, 2/6 systolic murmur at the apex. RESPIRATORY:  Clear to  auscultation without rales, wheezing or rhonchi  ABDOMEN: Soft, non-tender, non-distended MUSCULOSKELETAL:  No edema; No deformity  SKIN: Warm and dry NEUROLOGIC:  Alert and oriented x 3 PSYCHIATRIC:  Normal affect   Signed, Jenean Lindau, MD  11/13/2021 9:44 AM    [facility]

## 2021-11-14 LAB — BASIC METABOLIC PANEL
BUN/Creatinine Ratio: 12 (ref 9–23)
BUN: 11 mg/dL (ref 6–24)
CO2: 26 mmol/L (ref 20–29)
Calcium: 10.3 mg/dL — ABNORMAL HIGH (ref 8.7–10.2)
Chloride: 104 mmol/L (ref 96–106)
Creatinine, Ser: 0.95 mg/dL (ref 0.57–1.00)
Glucose: 91 mg/dL (ref 70–99)
Potassium: 4.3 mmol/L (ref 3.5–5.2)
Sodium: 142 mmol/L (ref 134–144)
eGFR: 73 mL/min/{1.73_m2} (ref >59)

## 2022-01-02 DIAGNOSIS — I10 Essential (primary) hypertension: Secondary | ICD-10-CM | POA: Diagnosis not present

## 2022-01-02 DIAGNOSIS — R609 Edema, unspecified: Secondary | ICD-10-CM | POA: Diagnosis not present

## 2022-01-02 DIAGNOSIS — R42 Dizziness and giddiness: Secondary | ICD-10-CM | POA: Diagnosis not present

## 2022-07-07 DIAGNOSIS — Z0000 Encounter for general adult medical examination without abnormal findings: Secondary | ICD-10-CM | POA: Diagnosis not present

## 2022-07-07 DIAGNOSIS — I119 Hypertensive heart disease without heart failure: Secondary | ICD-10-CM | POA: Diagnosis not present

## 2022-07-14 ENCOUNTER — Other Ambulatory Visit: Payer: Self-pay | Admitting: Internal Medicine

## 2022-07-14 DIAGNOSIS — Z1231 Encounter for screening mammogram for malignant neoplasm of breast: Secondary | ICD-10-CM

## 2022-07-21 DIAGNOSIS — I119 Hypertensive heart disease without heart failure: Secondary | ICD-10-CM | POA: Diagnosis not present

## 2022-07-28 DIAGNOSIS — Z3202 Encounter for pregnancy test, result negative: Secondary | ICD-10-CM | POA: Diagnosis not present

## 2022-07-28 DIAGNOSIS — Z30433 Encounter for removal and reinsertion of intrauterine contraceptive device: Secondary | ICD-10-CM | POA: Diagnosis not present

## 2022-08-19 DIAGNOSIS — Z3202 Encounter for pregnancy test, result negative: Secondary | ICD-10-CM | POA: Diagnosis not present

## 2022-08-19 DIAGNOSIS — Z30433 Encounter for removal and reinsertion of intrauterine contraceptive device: Secondary | ICD-10-CM | POA: Diagnosis not present

## 2022-09-23 ENCOUNTER — Ambulatory Visit
Admission: RE | Admit: 2022-09-23 | Discharge: 2022-09-23 | Disposition: A | Discharge status: Home / Self Care | Payer: BC Managed Care – PPO | Source: Ambulatory Visit | Attending: Internal Medicine | Admitting: Internal Medicine

## 2022-09-23 ENCOUNTER — Encounter: Payer: Self-pay | Admitting: Cardiology

## 2022-09-23 ENCOUNTER — Ambulatory Visit: Payer: BC Managed Care – PPO | Attending: Cardiology | Admitting: Cardiology

## 2022-09-23 VITALS — BP 132/78 | HR 84 | Ht 73.6 in | Wt 279.1 lb

## 2022-09-23 DIAGNOSIS — I10 Essential (primary) hypertension: Secondary | ICD-10-CM

## 2022-09-23 DIAGNOSIS — Z1231 Encounter for screening mammogram for malignant neoplasm of breast: Secondary | ICD-10-CM

## 2022-09-23 DIAGNOSIS — Z01419 Encounter for gynecological examination (general) (routine) without abnormal findings: Secondary | ICD-10-CM | POA: Diagnosis not present

## 2022-09-23 DIAGNOSIS — I340 Nonrheumatic mitral (valve) insufficiency: Secondary | ICD-10-CM

## 2022-09-23 NOTE — Patient Instructions (Signed)

## 2022-09-23 NOTE — Progress Notes (Signed)
Cardiology Office Note:    Date:  09/23/2022   ID:  Tonya Savage, DOB 21-Apr-1971, MRN [id number]  PCP:  Mckinley Jewel, MD  Cardiologist:  Jenean Lindau, MD   Referring MD: Mckinley Jewel, MD    ASSESSMENT:    1. Essential hypertension   2. Mitral valve insufficiency, unspecified etiology    PLAN:    In order of problems listed above:  Primary prevention stressed with the patient.  Importance of compliance with diet medication stressed and she vocalized understanding. Essential hypertension: Blood pressure stable and diet was emphasized.  Salt intake issues discussed.  Lifestyle modification urged.  She was advised to walk at least half an hour a day 5 days a week and she promises to do so. Obesity: Weight reduction stressed.  Risks of obesity explained.  I reviewed her blood work and discussed the findings with her at length.  LDL is mildly elevated and I told her diet targeted to lowering cholesterol and she vocalized understanding and questions were answered to her satisfaction.  She will be seen in follow-up appointment on a as needed basis.   Medication Adjustments/Labs and Tests Ordered: Current medicines are reviewed at length with the patient today.  Concerns regarding medicines are outlined above.  No orders of the defined types were placed in this encounter.  No orders of the defined types were placed in this encounter.    No chief complaint on file.    History of Present Illness:    Tonya Savage is a 51 y.o. female.  Patient has past medical history of essential hypertension, obesity.  She denies any problems at this time and takes care of activities of daily living.  No chest pain orthopnea or PND.  She has gained weight.  She leads a sedentary lifestyle.  At the time of my evaluation, the patient is alert awake oriented and in no distress.  Past Medical History:  Diagnosis Date   Cardiac murmur 12/21/2018   Chronic pain 11/12/2021   Edema  06/03/2021   Essential hypertension 12/21/2018   Family history of malignant neoplasm of ovary 06/03/2021   Heart murmur    Hypertension    Hypertensive heart disease without congestive heart failure 06/03/2021   Menorrhagia 06/03/2021   Mitral regurgitation 06/03/2021   Morbid obesity ([place]) 06/03/2021   Obesity (BMI 35.0-39.9 without comorbidity) 11/20/2020   Overweight 05/16/2020   Screening for cholesterol level 12/21/2018    Past Surgical History:  Procedure Laterality Date   DILATATION & CURETTAGE/HYSTEROSCOPY WITH MYOSURE N/A 04/08/2017   Procedure: DILATATION & CURETTAGE/HYSTEROSCOPY WITH MYOSURE;  Surgeon: Thurnell Lose, MD;  Location: [facility];  Service: Gynecology;  Laterality: N/A;   Fibroids Dr. Simona Huh requests XL Myosure. Dr. Simona Huh will start with the [facility].    FOOT SURGERY     HYSTEROSCOPY N/A 04/08/2017   Procedure: HYSTEROSCOPY WITH HYDROTHERMAL ABLATION;  Surgeon: Thurnell Lose, MD;  Location: [facility];  Service: Gynecology;  Laterality: N/A;   TONSILLECTOMY      Current Medications: Current Meds  Medication Sig   Cholecalciferol (VITAMIN D3) 50 MCG (2000 UT) TABS Take 1 tablet by mouth daily.   Ciclopirox 0.77 % gel Apply 1 application topically at bedtime.   furosemide (LASIX) 40 MG tablet Take 40-80 mg by mouth daily as needed for fluid (1-2 tablets as needed for edema).   losartan (COZAAR) 100 MG tablet Take 100 mg by mouth daily.   Multiple Vitamins-Minerals (HAIR SKIN NAILS PO) Take 1 tablet  by mouth daily.   naproxen (NAPROSYN) 500 MG tablet Take 500 mg by mouth every 12 (twelve) hours as needed for pain or headache.   spironolactone (ALDACTONE) 50 MG tablet Take 1 tablet (50 mg total) by mouth daily. PLEASE CALL OFFICE TO SCHEDULE APPOINTMENT FOR FURTHER REFILLS.     Allergies:   Keflex [cephalexin] and Lisinopril   Social History   Socioeconomic History   Marital status: Single    Spouse name: Not on file   Number of children: Not on file   Years of  education: Not on file   Highest education level: Not on file  Occupational History   Not on file  Tobacco Use   Smoking status: Never   Smokeless tobacco: Never  Substance and Sexual Activity   Alcohol use: No   Drug use: No   Sexual activity: Yes    Birth control/protection: Pill  Other Topics Concern   Not on file  Social History Narrative   Not on file   Social Determinants of Health   Financial Resource Strain: Not on file  Food Insecurity: Not on file  Transportation Needs: Not on file  Physical Activity: Not on file  Stress: Not on file  Social Connections: Not on file     Family History: The patient's family history includes Cancer in her maternal grandmother and mother; Hypertension in her mother.  ROS:   Please see the history of present illness.    All other systems reviewed and are negative.  EKGs/Labs/Other Studies Reviewed:    The following studies were reviewed today: EKG reveals sinus rhythm and nonspecific ST-T changes.   Recent Labs: 11/13/2021: BUN 11; Creatinine, Ser 0.95; Potassium 4.3; Sodium 142  Recent Lipid Panel    Component Value Date/Time   CHOL 181 06/05/2021 0849   TRIG 64 06/05/2021 0849   HDL 58 06/05/2021 0849   CHOLHDL 3.1 06/05/2021 0849   LDLCALC 111 (H) 06/05/2021 0849    Physical Exam:    VS:  BP 132/78   Pulse 84   Ht 6' 1.6" (1.869 m)   Wt 279 lb 1.3 oz (126.6 kg)   SpO2 99%   BMI 36.22 kg/m     Wt Readings from Last 3 Encounters:  09/23/22 279 lb 1.3 oz (126.6 kg)  11/13/21 265 lb (120.2 kg)  06/05/21 253 lb 1.3 oz (114.8 kg)     GEN: Patient is in no acute distress HEENT: Normal NECK: No JVD; No carotid bruits LYMPHATICS: No lymphadenopathy CARDIAC: Hear sounds regular, 2/6 systolic murmur at the apex. RESPIRATORY:  Clear to auscultation without rales, wheezing or rhonchi  ABDOMEN: Soft, non-tender, non-distended MUSCULOSKELETAL:  No edema; No deformity  SKIN: Warm and dry NEUROLOGIC:  Alert and  oriented x 3 PSYCHIATRIC:  Normal affect   Signed, Jenean Lindau, MD  09/23/2022 2:37 PM    [place]

## 2022-09-25 ENCOUNTER — Other Ambulatory Visit: Payer: Self-pay | Admitting: Internal Medicine

## 2022-09-25 DIAGNOSIS — R928 Other abnormal and inconclusive findings on diagnostic imaging of breast: Secondary | ICD-10-CM

## 2022-10-07 ENCOUNTER — Ambulatory Visit: Payer: BC Managed Care – PPO

## 2022-10-07 ENCOUNTER — Ambulatory Visit
Admission: RE | Admit: 2022-10-07 | Discharge: 2022-10-07 | Disposition: A | Discharge status: Home / Self Care | Payer: BC Managed Care – PPO | Source: Ambulatory Visit | Attending: Internal Medicine | Admitting: Internal Medicine

## 2022-10-07 DIAGNOSIS — R928 Other abnormal and inconclusive findings on diagnostic imaging of breast: Secondary | ICD-10-CM

## 2022-10-07 DIAGNOSIS — R92321 Mammographic fibroglandular density, right breast: Secondary | ICD-10-CM | POA: Diagnosis not present

## 2023-02-12 DIAGNOSIS — Z03818 Encounter for observation for suspected exposure to other biological agents ruled out: Secondary | ICD-10-CM | POA: Diagnosis not present

## 2023-02-12 DIAGNOSIS — R051 Acute cough: Secondary | ICD-10-CM | POA: Diagnosis not present

## 2023-02-12 DIAGNOSIS — R0981 Nasal congestion: Secondary | ICD-10-CM | POA: Diagnosis not present

## 2023-02-12 DIAGNOSIS — J069 Acute upper respiratory infection, unspecified: Secondary | ICD-10-CM | POA: Diagnosis not present

## 2023-03-10 DIAGNOSIS — Z23 Encounter for immunization: Secondary | ICD-10-CM | POA: Diagnosis not present

## 2023-07-09 DIAGNOSIS — E7800 Pure hypercholesterolemia, unspecified: Secondary | ICD-10-CM | POA: Diagnosis not present

## 2023-07-09 DIAGNOSIS — I119 Hypertensive heart disease without heart failure: Secondary | ICD-10-CM | POA: Diagnosis not present

## 2023-07-09 DIAGNOSIS — Z0000 Encounter for general adult medical examination without abnormal findings: Secondary | ICD-10-CM | POA: Diagnosis not present

## 2023-07-09 DIAGNOSIS — Z23 Encounter for immunization: Secondary | ICD-10-CM | POA: Diagnosis not present

## 2023-10-01 DIAGNOSIS — Z01419 Encounter for gynecological examination (general) (routine) without abnormal findings: Secondary | ICD-10-CM | POA: Diagnosis not present

## 2023-10-02 ENCOUNTER — Other Ambulatory Visit: Payer: Self-pay | Admitting: Internal Medicine

## 2023-10-02 DIAGNOSIS — Z1231 Encounter for screening mammogram for malignant neoplasm of breast: Secondary | ICD-10-CM

## 2023-10-06 ENCOUNTER — Inpatient Hospital Stay
Admission: RE | Admit: 2023-10-06 | Discharge: 2023-10-06 | Discharge status: Home / Self Care | Payer: BC Managed Care – PPO | Source: Ambulatory Visit | Attending: Internal Medicine

## 2023-10-06 DIAGNOSIS — Z1231 Encounter for screening mammogram for malignant neoplasm of breast: Secondary | ICD-10-CM

## 2023-11-25 DIAGNOSIS — D225 Melanocytic nevi of trunk: Secondary | ICD-10-CM | POA: Diagnosis not present

## 2023-11-25 DIAGNOSIS — L918 Other hypertrophic disorders of the skin: Secondary | ICD-10-CM | POA: Diagnosis not present

## 2023-11-25 DIAGNOSIS — D492 Neoplasm of unspecified behavior of bone, soft tissue, and skin: Secondary | ICD-10-CM | POA: Diagnosis not present

## 2023-11-25 DIAGNOSIS — L814 Other melanin hyperpigmentation: Secondary | ICD-10-CM | POA: Diagnosis not present

## 2023-11-25 DIAGNOSIS — L821 Other seborrheic keratosis: Secondary | ICD-10-CM | POA: Diagnosis not present

## 2024-01-29 ENCOUNTER — Encounter (HOSPITAL_BASED_OUTPATIENT_CLINIC_OR_DEPARTMENT_OTHER): Payer: Self-pay

## 2024-01-29 ENCOUNTER — Emergency Department (HOSPITAL_BASED_OUTPATIENT_CLINIC_OR_DEPARTMENT_OTHER): Payer: BC Managed Care – PPO

## 2024-01-29 ENCOUNTER — Emergency Department (HOSPITAL_BASED_OUTPATIENT_CLINIC_OR_DEPARTMENT_OTHER)
Admission: EM | Admit: 2024-01-29 | Discharge: 2024-01-29 | Disposition: A | Discharge status: Home / Self Care | Payer: BC Managed Care – PPO

## 2024-01-29 ENCOUNTER — Other Ambulatory Visit: Payer: Self-pay

## 2024-01-29 DIAGNOSIS — I10 Essential (primary) hypertension: Secondary | ICD-10-CM | POA: Diagnosis not present

## 2024-01-29 DIAGNOSIS — D649 Anemia, unspecified: Secondary | ICD-10-CM | POA: Diagnosis not present

## 2024-01-29 DIAGNOSIS — R55 Syncope and collapse: Secondary | ICD-10-CM | POA: Diagnosis present

## 2024-01-29 DIAGNOSIS — R202 Paresthesia of skin: Secondary | ICD-10-CM | POA: Diagnosis not present

## 2024-01-29 DIAGNOSIS — Z79899 Other long term (current) drug therapy: Secondary | ICD-10-CM | POA: Diagnosis not present

## 2024-01-29 LAB — COMPREHENSIVE METABOLIC PANEL
ALT: 17 U/L (ref 0–44)
AST: 18 U/L (ref 15–41)
Albumin: 4 g/dL (ref 3.5–5.0)
Alkaline Phosphatase: 85 U/L (ref 38–126)
Anion gap: 10 (ref 5–15)
BUN: 20 mg/dL (ref 6–20)
CO2: 27 mmol/L (ref 22–32)
Calcium: 10.3 mg/dL (ref 8.9–10.3)
Chloride: 101 mmol/L (ref 98–111)
Creatinine, Ser: 1.09 mg/dL — ABNORMAL HIGH (ref 0.44–1.00)
GFR, Estimated: >60 mL/min (ref >60)
Glucose, Bld: 74 mg/dL (ref 70–99)
Potassium: 3.6 mmol/L (ref 3.5–5.1)
Sodium: 138 mmol/L (ref 135–145)
Total Bilirubin: 0.5 mg/dL (ref 0.0–1.2)
Total Protein: 7.4 g/dL (ref 6.5–8.1)

## 2024-01-29 LAB — CBC
HCT: 34.2 % — ABNORMAL LOW (ref 36.0–46.0)
Hemoglobin: 10.9 g/dL — ABNORMAL LOW (ref 12.0–15.0)
MCH: 25.8 pg — ABNORMAL LOW (ref 26.0–34.0)
MCHC: 31.9 g/dL (ref 30.0–36.0)
MCV: 80.9 fL (ref 80.0–100.0)
Platelets: 244 10*3/uL (ref 150–400)
RBC: 4.23 MIL/uL (ref 3.87–5.11)
RDW: 15.8 % — ABNORMAL HIGH (ref 11.5–15.5)
WBC: 3.2 10*3/uL — ABNORMAL LOW (ref 4.0–10.5)
nRBC: 0 % (ref 0.0–0.2)

## 2024-01-29 LAB — MAGNESIUM: Magnesium: 1.8 mg/dL (ref 1.7–2.4)

## 2024-01-29 LAB — TROPONIN I (HIGH SENSITIVITY): Troponin I (High Sensitivity): 3 ng/L (ref <18)

## 2024-01-29 NOTE — ED Triage Notes (Signed)
In for eval of tingling that started while sitting in her desk at work onset 30 minutes PTA. Started in left 3rd, 4th, and 5th fingers then progressed to left forearm and left upper arm. Then reports her "mouth started waters and feeling like she was going to pass out". Negative syncopal episode. Denies headache, blurred vision, nausea or vomiting at this time. No motor deficits, facial symmetry equal, sensation intact.

## 2024-01-29 NOTE — ED Provider Notes (Signed)
[facility] Provider Note   CSN: [id number] Arrival date & time: 01/29/24  1103     History  Chief Complaint  Patient presents with   Tingling    Tonya Savage is a 52 y.o. female.  This is a 53 year old female presenting emergency department with left hand tingling sensation and near syncopal episode while at work.  Was sitting at her desk resting elbows on table when she developed tingling sensation to her left pinky ring and middle finger.  Seemingly had some sporadic tingling to her forearm and upper arm as well.  She noted shortly thereafter she felt lightheaded and that she was going to pass out.  Was not having chest pain or palpitations.  No shortness of breath.  Denies headache, vision changes, facial droop, difficulty finding words, unilateral weakness.  Symptoms lasted several minutes and has resolved.  She states that he is asymptomatic currently.  Has a history of hypertension.        Home Medications Prior to Admission medications   Medication Sig Start Date End Date Taking? Authorizing Provider  Cholecalciferol (VITAMIN D3) 50 MCG (2000 UT) TABS Take 1 tablet by mouth daily.    [provider]  Ciclopirox 0.77 % gel Apply 1 application topically at bedtime. 07/02/21   [provider]  furosemide (LASIX) 40 MG tablet Take 40-80 mg by mouth daily as needed for fluid (1-2 tablets as needed for edema).    [provider]  losartan (COZAAR) 100 MG tablet Take 100 mg by mouth daily.    [provider]  Multiple Vitamins-Minerals (HAIR SKIN NAILS PO) Take 1 tablet by mouth daily.    [provider]  naproxen (NAPROSYN) 500 MG tablet Take 500 mg by mouth every 12 (twelve) hours as needed for pain or headache. 12/20/19   [provider]  spironolactone (ALDACTONE) 50 MG tablet Take 1 tablet (50 mg total) by mouth daily. PLEASE CALL OFFICE TO SCHEDULE APPOINTMENT FOR FURTHER  REFILLS. 04/03/20   Revankar, Aundra Dubin, MD      Allergies    Keflex [cephalexin] and Lisinopril    Review of Systems   Review of Systems  Physical Exam Updated Vital Signs BP (!) 144/84   Pulse 89   Temp (!) 97.3 F (36.3 C)   Resp 15   Ht 6' 1.5" (1.867 m)   Wt 106.1 kg   SpO2 100%   BMI 30.45 kg/m  Physical Exam Vitals and nursing note reviewed.  Constitutional:      Appearance: She is obese.  HENT:     Head: Normocephalic and atraumatic.     Mouth/Throat:     Mouth: Mucous membranes are moist.  Eyes:     Extraocular Movements: Extraocular movements intact.     Conjunctiva/sclera: Conjunctivae normal.     Pupils: Pupils are equal, round, and reactive to light.  Cardiovascular:     Rate and Rhythm: Normal rate and regular rhythm.  Pulmonary:     Effort: Pulmonary effort is normal.     Breath sounds: Normal breath sounds.  Abdominal:     General: Abdomen is flat. There is no distension.     Tenderness: There is no abdominal tenderness. There is no guarding or rebound.  Musculoskeletal:        General: Normal range of motion.     Cervical back: Normal range of motion. No tenderness.  Skin:    General: Skin is warm and dry.  Capillary Refill: Capillary refill takes less than 2 seconds.  Neurological:     General: No focal deficit present.     Mental Status: She is alert and oriented to person, place, and time. Mental status is at baseline.     Cranial Nerves: No cranial nerve deficit.     Sensory: No sensory deficit.     Motor: No weakness.     Coordination: Coordination normal.  Psychiatric:        Mood and Affect: Mood normal.        Behavior: Behavior normal.     ED Results / Procedures / Treatments   Labs (all labs ordered are listed, but only abnormal results are displayed) Labs Reviewed  CBC - Abnormal; Notable for the following components:      Result Value   WBC 3.2 (*)    Hemoglobin 10.9 (*)    HCT 34.2 (*)    MCH 25.8 (*)    RDW 15.8 (*)     All other components within normal limits  COMPREHENSIVE METABOLIC PANEL - Abnormal; Notable for the following components:   Creatinine, Ser 1.09 (*)    All other components within normal limits  MAGNESIUM  TROPONIN I (HIGH SENSITIVITY)    EKG None  Radiology DG Chest 2 View Result Date: 01/29/2024 CLINICAL DATA:  Syncope. EXAM: CHEST - 2 VIEW COMPARISON:  Chest radiograph dated Apr 15, 2016. FINDINGS: The heart size and mediastinal contours are within normal limits. Similar nipple shadow projecting over the left lung base. No focal consolidation, pleural effusion, or pneumothorax. No acute osseous abnormality. IMPRESSION: No acute cardiopulmonary findings. Electronically Signed   By: Hart Robinsons M.D.   On: 01/29/2024 14:31   CT Head Wo Contrast Result Date: 01/29/2024 CLINICAL DATA:  Transient ischemic attack (TIA); Cervical radiculopathy, no red flags EXAM: CT HEAD WITHOUT CONTRAST CT CERVICAL SPINE WITHOUT CONTRAST TECHNIQUE: Multidetector CT imaging of the head and cervical spine was performed following the standard protocol without intravenous contrast. Multiplanar CT image reconstructions of the cervical spine were also generated. RADIATION DOSE REDUCTION: This exam was performed according to the departmental dose-optimization program which includes automated exposure control, adjustment of the mA and/or kV according to patient size and/or use of iterative reconstruction technique. COMPARISON:  None Available. FINDINGS: CT HEAD FINDINGS Brain: No hemorrhage. No hydrocephalus. No extra-axial fluid collection. No mass effect. No lesion. No CT evidence of an acute cortical infarct. Mineralization of the basal ganglia bilaterally Vascular: No hyperdense vessel or unexpected calcification. Skull: Normal. Negative for fracture or focal lesion. Sinuses/Orbits: No middle ear or mastoid effusion. Paranasal are clear. Orbits are unremarkable Other: None. CT CERVICAL SPINE FINDINGS Alignment: Mild  retrolisthesis of C5 on C6 Skull base and vertebrae: No acute fracture. No primary bone lesion or focal pathologic process. Soft tissues and spinal canal: No prevertebral fluid or swelling. No visible canal hematoma. Disc levels: There is moderate spinal canal narrowing at C4-C5 and C5-C6 secondary to a calcified disc bulge Upper chest: Negative Other: None IMPRESSION: 1. No CT evidence of intracranial injury. 2. No acute fracture or traumatic subluxation of the cervical spine. 3. Moderate spinal canal narrowing at C4-C5 and C5-C6 secondary to a calcified disc bulge. Electronically Signed   By: Lorenza Cambridge M.D.   On: 01/29/2024 13:09   CT Cervical Spine Wo Contrast Result Date: 01/29/2024 CLINICAL DATA:  Transient ischemic attack (TIA); Cervical radiculopathy, no red flags EXAM: CT HEAD WITHOUT CONTRAST CT CERVICAL SPINE WITHOUT CONTRAST TECHNIQUE: Multidetector CT  imaging of the head and cervical spine was performed following the standard protocol without intravenous contrast. Multiplanar CT image reconstructions of the cervical spine were also generated. RADIATION DOSE REDUCTION: This exam was performed according to the departmental dose-optimization program which includes automated exposure control, adjustment of the mA and/or kV according to patient size and/or use of iterative reconstruction technique. COMPARISON:  None Available. FINDINGS: CT HEAD FINDINGS Brain: No hemorrhage. No hydrocephalus. No extra-axial fluid collection. No mass effect. No lesion. No CT evidence of an acute cortical infarct. Mineralization of the basal ganglia bilaterally Vascular: No hyperdense vessel or unexpected calcification. Skull: Normal. Negative for fracture or focal lesion. Sinuses/Orbits: No middle ear or mastoid effusion. Paranasal are clear. Orbits are unremarkable Other: None. CT CERVICAL SPINE FINDINGS Alignment: Mild retrolisthesis of C5 on C6 Skull base and vertebrae: No acute fracture. No primary bone lesion or  focal pathologic process. Soft tissues and spinal canal: No prevertebral fluid or swelling. No visible canal hematoma. Disc levels: There is moderate spinal canal narrowing at C4-C5 and C5-C6 secondary to a calcified disc bulge Upper chest: Negative Other: None IMPRESSION: 1. No CT evidence of intracranial injury. 2. No acute fracture or traumatic subluxation of the cervical spine. 3. Moderate spinal canal narrowing at C4-C5 and C5-C6 secondary to a calcified disc bulge. Electronically Signed   By: Lorenza Cambridge M.D.   On: 01/29/2024 13:09    Procedures Procedures    Medications Ordered in ED Medications - No data to display  ED Course/ Medical Decision Making/ A&P Clinical Course as of 01/29/24 1445  Fri Jan 29, 2024  1443 Workup reassuring.  Minor elevation in creatinine, otherwise comprehensive metabolic panel with no significant abnormalities that explain her symptoms.  Magnesium level is normal.  Bonin negative.  EKG without ST segment changes to indicate ischemia; no arrhythmia.  Does have leukopenia and anemia.  Reportedly just gave blood last week.  Her chest x-ray negative for acute pathology.  CT head C-spine without acute pathology.  C-spine did have some arthritic changes.  Patient again is asymptomatic.  Vital signs stable.  Stable for discharge at this time. [TY]    Clinical Course User Index [TY] Coral Spikes, DO                                 Medical Decision Making This is a well-appearing 53 year old female presenting emergency department for tingling to her left hand in the ulnar distribution as well as a near syncopal event while at work.  She is afebrile nontachycardic, slightly hypertensive.  Currently asymptomatic and not having any symptoms.  Her neuroexam is normal.  Per chart review does have history of hypertension and heart murmur.  She denies any trauma, no infectious symptoms.  Not having chest pain.  Appears to be normal sinus rhythm at a rate in the low 80s on  the monitor on my independent interpretation.  EKG appears to be normal sinus rhythm at 84 bpm.  Normal intervals.  No QTc prolongation.  No ST segment changes to indicate ischemia.  Given patient's age and near syncope will get cardiac evaluation.  I suspect her tingling sensation is from a peripheral source as it seemingly followed a ulnar nerve distribution.  However, out of abundance of caution we will get imaging of head and neck to exclude overt pathology/malignancy.  See ED course for further MDM and final disposition.  Amount and/or Complexity of  Data Reviewed External Data Reviewed:     Details: Per chart review had an echo in 2020 which showed mild mitral regurgitation. Labs: ordered. Radiology: ordered.         Final Clinical Impression(s) / ED Diagnoses Final diagnoses:  Near syncope  Anemia, unspecified type    Rx / DC Orders ED Discharge Orders     None         Coral Spikes, DO 01/29/24 1445

## 2024-01-29 NOTE — Discharge Instructions (Signed)
Please follow-up with your primary doctor.  Please return immediately if develop any fevers, chills, chest pain, shortness of breath, sudden onset headache, vision changes, facial droop, difficulty finding words, unilateral weakness, or any new or worsening symptoms that are concerning to you.

## 2024-08-22 ENCOUNTER — Other Ambulatory Visit: Payer: Self-pay | Admitting: Internal Medicine

## 2024-08-22 DIAGNOSIS — Z1231 Encounter for screening mammogram for malignant neoplasm of breast: Secondary | ICD-10-CM

## 2024-10-19 ENCOUNTER — Ambulatory Visit
Admission: RE | Admit: 2024-10-19 | Discharge: 2024-10-19 | Disposition: A | Source: Ambulatory Visit | Attending: Internal Medicine

## 2024-10-19 DIAGNOSIS — Z1231 Encounter for screening mammogram for malignant neoplasm of breast: Secondary | ICD-10-CM
